# Patient Record
Sex: Male | Born: 1985 | Race: Black or African American | Hispanic: No | Marital: Single | Smoking: Current every day smoker
Health system: Southern US, Community
[De-identification: ages and names within clinical notes are randomized; demographics above are authoritative.]

## PROBLEM LIST (undated history)

## (undated) ENCOUNTER — Emergency Department: Admission: EM | Payer: Self-pay | Source: Home / Self Care

## (undated) DIAGNOSIS — I10 Essential (primary) hypertension: Secondary | ICD-10-CM

## (undated) DIAGNOSIS — J45909 Unspecified asthma, uncomplicated: Secondary | ICD-10-CM

## (undated) HISTORY — PX: ABDOMINAL SURGERY: SHX537

---

## 2006-12-09 ENCOUNTER — Emergency Department: Payer: Self-pay | Admitting: Emergency Medicine

## 2007-02-04 ENCOUNTER — Emergency Department: Payer: Self-pay | Admitting: Emergency Medicine

## 2007-02-04 ENCOUNTER — Other Ambulatory Visit: Payer: Self-pay

## 2019-04-27 ENCOUNTER — Other Ambulatory Visit: Payer: Self-pay | Admitting: Family Medicine

## 2019-04-27 DIAGNOSIS — Z20822 Contact with and (suspected) exposure to covid-19: Secondary | ICD-10-CM

## 2019-05-01 LAB — NOVEL CORONAVIRUS, NAA: SARS-CoV-2, NAA: NOT DETECTED

## 2019-05-09 ENCOUNTER — Telehealth: Payer: Self-pay

## 2019-05-09 NOTE — Telephone Encounter (Signed)
Advised patient his COVID 19 test results are negative. °

## 2019-07-03 ENCOUNTER — Encounter: Payer: Self-pay | Admitting: Emergency Medicine

## 2019-07-03 ENCOUNTER — Other Ambulatory Visit: Payer: Self-pay

## 2019-07-03 ENCOUNTER — Emergency Department
Admission: EM | Admit: 2019-07-03 | Discharge: 2019-07-03 | Disposition: A | Discharge status: Home / Self Care | Payer: HRSA Program | Attending: Emergency Medicine | Admitting: Emergency Medicine

## 2019-07-03 DIAGNOSIS — J029 Acute pharyngitis, unspecified: Secondary | ICD-10-CM | POA: Diagnosis not present

## 2019-07-03 DIAGNOSIS — R05 Cough: Secondary | ICD-10-CM | POA: Insufficient documentation

## 2019-07-03 DIAGNOSIS — Z20828 Contact with and (suspected) exposure to other viral communicable diseases: Secondary | ICD-10-CM | POA: Diagnosis not present

## 2019-07-03 DIAGNOSIS — J45909 Unspecified asthma, uncomplicated: Secondary | ICD-10-CM | POA: Insufficient documentation

## 2019-07-03 DIAGNOSIS — R0981 Nasal congestion: Secondary | ICD-10-CM | POA: Insufficient documentation

## 2019-07-03 DIAGNOSIS — R5381 Other malaise: Secondary | ICD-10-CM | POA: Insufficient documentation

## 2019-07-03 DIAGNOSIS — Z20822 Contact with and (suspected) exposure to covid-19: Secondary | ICD-10-CM

## 2019-07-03 DIAGNOSIS — F17210 Nicotine dependence, cigarettes, uncomplicated: Secondary | ICD-10-CM | POA: Insufficient documentation

## 2019-07-03 HISTORY — DX: Unspecified asthma, uncomplicated: J45.909

## 2019-07-03 MED ORDER — PSEUDOEPH-BROMPHEN-DM 30-2-10 MG/5ML PO SYRP: 10.0000 mL | ORAL_SOLUTION | Freq: Four times a day (QID) | ORAL | 0 refills | Status: DC | PRN

## 2019-07-03 MED ORDER — FLUTICASONE PROPIONATE 50 MCG/ACT NA SUSP: 1.0000 | Freq: Two times a day (BID) | NASAL | 0 refills | Status: AC

## 2019-07-03 NOTE — ED Provider Notes (Signed)
[facility] Emergency Department Provider Note  ____________________________________________  Time seen: Approximately 5:39 PM  I have reviewed the triage vital signs and the nursing notes.   HISTORY  Chief Complaint URI    HPI Harold Jackson is a 33 y.o. male who presents the emergency department for evaluation of nasal congestion, sore throat, cough, malaise.  Patient reports that symptoms began 3 days ago primarily with nasal congestion.  The rest of the symptoms have progressed until patient developed a worsening cough today.  Patient reports that he was sent home from work given his symptoms and concern for COVID-19.  Patient has no known direct COVID-19 contact.  No medications prior to arrival.  Patient denies any headache, neck pain or stiffness, chest pain, shortness of breath, abdominal pain, nausea vomiting, diarrhea or constipation.  No other complaints at this time.         Past Medical History:  Diagnosis Date  . Asthma     There are no active problems to display for this patient.   Past Surgical History:  Procedure Laterality Date  . ABDOMINAL SURGERY      Prior to Admission medications   Medication Sig Start Date End Date Taking? Authorizing Provider  brompheniramine-pseudoephedrine-DM 30-2-10 MG/5ML syrup Take 10 mLs by mouth 4 (four) times daily as needed. 07/03/19   Cuthriell, Charline Bills, PA-C  fluticasone (FLONASE) 50 MCG/ACT nasal spray Place 1 spray into both nostrils 2 (two) times daily. 07/03/19   Cuthriell, Charline Bills, PA-C    Allergies Patient has no known allergies.  No family history on file.  Social History Social History   Tobacco Use  . Smoking status: Current Every Day Smoker    Types: Cigarettes  . Smokeless tobacco: Never Used  Substance Use Topics  . Alcohol use: Yes  . Drug use: Never     Review of Systems  Constitutional: No fever/chills Eyes: No visual changes. No discharge ENT: Positive for nasal  congestion Cardiovascular: no chest pain. Respiratory: Positive cough. No SOB. Gastrointestinal: No abdominal pain.  No nausea, no vomiting.  No diarrhea.  No constipation. Genitourinary: Negative for dysuria. No hematuria Musculoskeletal: Negative for musculoskeletal pain. Skin: Negative for rash, abrasions, lacerations, ecchymosis. Neurological: Negative for headaches, focal weakness or numbness. 10-point ROS otherwise negative.  ____________________________________________   PHYSICAL EXAM:  VITAL SIGNS: ED Triage Vitals  Enc Vitals Group     BP 07/03/19 1718 (!) 155/98     Pulse Rate 07/03/19 1718 71     Resp 07/03/19 1718 16     Temp 07/03/19 1718 98.4 F (36.9 C)     Temp Source 07/03/19 1718 Oral     SpO2 07/03/19 1718 98 %     Weight 07/03/19 1716 245 lb (111.1 kg)     Height 07/03/19 1716 6\' 3"  (1.905 m)     Head Circumference --      Peak Flow --      Pain Score 07/03/19 1716 0     Pain Loc --      Pain Edu? --      Excl. in [place]? --      Constitutional: Alert and oriented. Well appearing and in no acute distress. Eyes: Conjunctivae are normal. PERRL. EOMI. Head: Atraumatic. ENT:      Ears: EACs and TMs unremarkable bilaterally.      Nose: Mild clear congestion/rhinnorhea.      Mouth/Throat: Mucous membranes are moist.  Oropharynx is nonerythematous and nonedematous.  Uvula is midline. Neck:  No stridor.  Neck is supple full range of motion Hematological/Lymphatic/Immunilogical: No cervical lymphadenopathy. Cardiovascular: Normal rate, regular rhythm. Normal S1 and S2.  Good peripheral circulation. Respiratory: Normal respiratory effort without tachypnea or retractions. Lungs CTAB. Good air entry to the bases with no decreased or absent breath sounds. Musculoskeletal: Full range of motion to all extremities. No gross deformities appreciated. Neurologic:  Normal speech and language. No gross focal neurologic deficits are appreciated.  Skin:  Skin is warm, dry and  intact. No rash noted. Psychiatric: Mood and affect are normal. Speech and behavior are normal. Patient exhibits appropriate insight and judgement.   ____________________________________________   LABS (all labs ordered are listed, but only abnormal results are displayed)  Labs Reviewed  SARS CORONAVIRUS 2 (TAT 6-24 HRS)   ____________________________________________  EKG   ____________________________________________  RADIOLOGY   No results found.  ____________________________________________    PROCEDURES  Procedure(s) performed:    Procedures    Medications - No data to display   ____________________________________________   INITIAL IMPRESSION / ASSESSMENT AND PLAN / ED COURSE  Pertinent labs & imaging results that were available during my care of the patient were reviewed by me and considered in my medical decision making (see chart for details).  Review of the  CSRS was performed in accordance of the [facility] prior to dispensing any controlled drugs.           Patient's diagnosis is consistent with suspected given 19 infection.  Patient presented to the emergency department complaining of nasal congestion, malaise, cough.  Patient was sent home from work for COVID-19 test.  Differential includes viral URI, bronchitis, pneumonia, COVID-19.  Patient will be tested for COVID-19 at this time.  Patient be given Flonase for nasal congestion, Bromfed cough syrup for cough.  Patient is to self quarantine until results have returned..  Follow-up primary care as needed. patient is given ED precautions to return to the ED for any worsening or new symptoms.     ____________________________________________  FINAL CLINICAL IMPRESSION(S) / ED DIAGNOSES  Final diagnoses:  Suspected Covid-19 Virus Infection      NEW MEDICATIONS STARTED DURING THIS VISIT:  ED Discharge Orders         Ordered    fluticasone (FLONASE) 50 MCG/ACT nasal spray  2 times daily      07/03/19 1801    brompheniramine-pseudoephedrine-DM 30-2-10 MG/5ML syrup  4 times daily PRN     07/03/19 1801              This chart was dictated using voice recognition software/Dragon. Despite best efforts to proofread, errors can occur which can change the meaning. Any change was purely unintentional.    Racheal PatchesCuthriell, Jonathan D, PA-C 07/03/19 1801    Arnaldo NatalMalinda, Paul F, MD 07/03/19 2255

## 2019-07-03 NOTE — ED Triage Notes (Signed)
C/O sinus congestion and cough.  Initially started Saturday and worsened Sunday.  AAOx3.  Skin warm and dry.  No SOB/ DOE

## 2019-07-04 LAB — SARS CORONAVIRUS 2 (TAT 6-24 HRS): SARS Coronavirus 2: NEGATIVE

## 2019-07-26 ENCOUNTER — Emergency Department
Admission: EM | Admit: 2019-07-26 | Discharge: 2019-07-26 | Disposition: A | Discharge status: Home / Self Care | Payer: Self-pay | Attending: Student in an Organized Health Care Education/Training Program | Admitting: Student in an Organized Health Care Education/Training Program

## 2019-07-26 ENCOUNTER — Encounter: Payer: Self-pay | Admitting: Emergency Medicine

## 2019-07-26 ENCOUNTER — Other Ambulatory Visit: Payer: Self-pay

## 2019-07-26 DIAGNOSIS — J45909 Unspecified asthma, uncomplicated: Secondary | ICD-10-CM | POA: Insufficient documentation

## 2019-07-26 DIAGNOSIS — I10 Essential (primary) hypertension: Secondary | ICD-10-CM | POA: Insufficient documentation

## 2019-07-26 DIAGNOSIS — F17210 Nicotine dependence, cigarettes, uncomplicated: Secondary | ICD-10-CM | POA: Insufficient documentation

## 2019-07-26 LAB — BASIC METABOLIC PANEL
Anion gap: 11 (ref 5–15)
BUN: 8 mg/dL (ref 6–20)
CO2: 26 mmol/L (ref 22–32)
Calcium: 9.6 mg/dL (ref 8.9–10.3)
Chloride: 101 mmol/L (ref 98–111)
Creatinine, Ser: 1.24 mg/dL (ref 0.61–1.24)
GFR calc Af Amer: >60 mL/min (ref >60)
GFR calc non Af Amer: >60 mL/min (ref >60)
Glucose, Bld: 120 mg/dL — ABNORMAL HIGH (ref 70–99)
Potassium: 3.8 mmol/L (ref 3.5–5.1)
Sodium: 138 mmol/L (ref 135–145)

## 2019-07-26 MED ORDER — HYDROCHLOROTHIAZIDE 12.5 MG PO CAPS: 12.5000 mg | ORAL_CAPSULE | Freq: Every day | ORAL | 0 refills | Status: DC

## 2019-07-26 NOTE — ED Provider Notes (Signed)
[facility] Emergency Department Provider Note    First MD Initiated Contact with Patient 07/26/19 Harold Jackson     (approximate)  I have reviewed the triage vital signs and the nursing notes.   HISTORY  Chief Complaint Hypertension    HPI Harold Jackson is a 33 y.o. male no significant past medical history presents to ER for evaluation of elevated blood pressure that was measured plasma center while he was trying to donate today.  Has been taking some anti-inflammatories.  Works on [employer].  Denies any history of heart disease.  No history of hypertension personally but does have significant family history.  His brother who is only 53 years older than him was recently diagnosed with high blood pressure and placed on blood pressure medication has been well managed.  He denies any chest pain headache blurry vision, numbness or tingling.    Past Medical History:  Diagnosis Date  . Asthma    No family history on file. Past Surgical History:  Procedure Laterality Date  . ABDOMINAL SURGERY     There are no active problems to display for this patient.     Prior to Admission medications   Medication Sig Start Date End Date Taking? Authorizing Provider  brompheniramine-pseudoephedrine-DM 30-2-10 MG/5ML syrup Take 10 mLs by mouth 4 (four) times daily as needed. 07/03/19   Cuthriell, Charline Bills, PA-C  fluticasone (FLONASE) 50 MCG/ACT nasal spray Place 1 spray into both nostrils 2 (two) times daily. 07/03/19   Cuthriell, Charline Bills, PA-C  hydrochlorothiazide (MICROZIDE) 12.5 MG capsule Take 1 capsule (12.5 mg total) by mouth daily. 07/26/19 07/25/20  Merlyn Lot, MD    Allergies Patient has no known allergies.    Social History Social History   Tobacco Use  . Smoking status: Current Every Day Smoker    Types: Cigarettes  . Smokeless tobacco: Never Used  Substance Use Topics  . Alcohol use: Yes  . Drug use: Never    Review of Systems Patient  denies headaches, rhinorrhea, blurry vision, numbness, shortness of breath, chest pain, edema, cough, abdominal pain, nausea, vomiting, diarrhea, dysuria, fevers, rashes or hallucinations unless otherwise stated above in HPI. ____________________________________________   PHYSICAL EXAM:  VITAL SIGNS: Vitals:   07/26/19 1830  BP: (!) 182/127  Pulse: 72  Resp: 16  Temp: 97.8 F (36.6 C)  SpO2: 100%    Constitutional: Alert and oriented.  Eyes: Conjunctivae are normal.  Head: Atraumatic. Nose: No congestion/rhinnorhea. Mouth/Throat: Mucous membranes are moist.   Neck: No stridor. Painless ROM.  Cardiovascular: Normal rate, regular rhythm. Grossly normal heart sounds.  Good peripheral circulation. Respiratory: Normal respiratory effort.  No retractions. Lungs CTAB. Gastrointestinal: Soft and nontender. No distention. No abdominal bruits. No CVA tenderness. Genitourinary:  Musculoskeletal: No lower extremity tenderness nor edema.  No joint effusions. Neurologic:  Normal speech and language. No gross focal neurologic deficits are appreciated. No facial droop Skin:  Skin is warm, dry and intact. No rash noted. Psychiatric: Mood and affect are normal. Speech and behavior are normal.  ____________________________________________   LABS (all labs ordered are listed, but only abnormal results are displayed)  Results for orders placed or performed during the hospital encounter of 07/26/19 (from the past 24 hour(s))  Basic metabolic panel     Status: Abnormal   Collection Time: 07/26/19  7:35 PM  Result Value Ref Range   Sodium 138 135 - 145 mmol/L   Potassium 3.8 3.5 - 5.1 mmol/L   Chloride 101 98 -  111 mmol/L   CO2 26 22 - 32 mmol/L   Glucose, Bld 120 (H) 70 - 99 mg/dL   BUN 8 6 - 20 mg/dL   Creatinine, Ser 4.56 0.61 - 1.24 mg/dL   Calcium 9.6 8.9 - 25.6 mg/dL   GFR calc non Af Amer >60 >60 mL/min   GFR calc Af Amer >60 >60 mL/min   Anion gap 11 5 - 15    ____________________________________________  EKG____________________________________________   PROCEDURES  Procedure(s) performed:  Procedures    Critical Care performed: no ____________________________________________   INITIAL IMPRESSION / ASSESSMENT AND PLAN / ED COURSE  Pertinent labs & imaging results that were available during my care of the patient were reviewed by me and considered in my medical decision making (see chart for details).   DDX: htn, htnive urgency, stress, whitecoat htn  Harold Jackson is a 33 y.o. who presents to the ED with concern of elevated blood pressure.  He is completely asymptomatic with reassuring exam.  Denies any chest pain or shortness of breath.  Baseline renal function electrolytes ordered as I do want to start him on some antihypertensive medication given his family history and without any PCP at this time.  Renal function is okay.  Will start him on low-dose HCTZ.  His sister works at a local clinic so hopefully we can get him an outpatient follow-up.  He feels comfortable with this plan.  Denies any other concerns or symptoms.  Have discussed with the patient and available family all diagnostics and treatments performed thus far and all questions were answered to the best of my ability. The patient demonstrates understanding and agreement with plan.      The patient was evaluated in Emergency Department today for the symptoms described in the history of present illness. He/she was evaluated in the context of the global COVID-19 pandemic, which necessitated consideration that the patient might be at risk for infection with the SARS-CoV-2 virus that causes COVID-19. Institutional protocols and algorithms that pertain to the evaluation of patients at risk for COVID-19 are in a state of rapid change based on information released by regulatory bodies including the CDC and federal and state organizations. These policies and algorithms were followed  during the patient's care in the ED.  As part of my medical decision making, I reviewed the following data within the electronic MEDICAL RECORD NUMBER Nursing notes reviewed and incorporated, Labs reviewed, notes from prior ED visits and [place] Controlled Substance Database   ____________________________________________   FINAL CLINICAL IMPRESSION(S) / ED DIAGNOSES  Final diagnoses:  Hypertension, unspecified type      NEW MEDICATIONS STARTED DURING THIS VISIT:  New Prescriptions   HYDROCHLOROTHIAZIDE (MICROZIDE) 12.5 MG CAPSULE    Take 1 capsule (12.5 mg total) by mouth daily.     Note:  This document was prepared using Dragon voice recognition software and may include unintentional dictation errors.    Willy Eddy, MD 07/26/19 [phone]

## 2019-07-26 NOTE — ED Notes (Addendum)
Patient reports familial history of hypertension in first degree family members. Denies previous individual history of high blood pressure. Denies headaches. Denies vision changes. Denies shortness of breath. Endorses intermittent (L) eye vision blurriness secondary to lump that has been present since childhood.

## 2019-07-26 NOTE — ED Triage Notes (Signed)
Patient reports he was supposed to donate plasma and predonation blood pressure was extremely elevated. Patient denies any HA, dizziness or CP. Patient states "I feel fine". Patient denies hx of HTN, however reports significant family history.

## 2019-07-26 NOTE — ED Notes (Signed)
No protocols needed at this time per Dr. Corky Downs

## 2019-08-25 ENCOUNTER — Other Ambulatory Visit: Payer: Self-pay

## 2019-08-25 ENCOUNTER — Emergency Department
Admission: EM | Admit: 2019-08-25 | Discharge: 2019-08-25 | Disposition: A | Discharge status: Home / Self Care | Payer: Self-pay | Attending: Emergency Medicine | Admitting: Emergency Medicine

## 2019-08-25 ENCOUNTER — Encounter: Payer: Self-pay | Admitting: Emergency Medicine

## 2019-08-25 DIAGNOSIS — J45909 Unspecified asthma, uncomplicated: Secondary | ICD-10-CM | POA: Insufficient documentation

## 2019-08-25 DIAGNOSIS — Z20828 Contact with and (suspected) exposure to other viral communicable diseases: Secondary | ICD-10-CM | POA: Insufficient documentation

## 2019-08-25 DIAGNOSIS — F17210 Nicotine dependence, cigarettes, uncomplicated: Secondary | ICD-10-CM | POA: Insufficient documentation

## 2019-08-25 DIAGNOSIS — Z20822 Contact with and (suspected) exposure to covid-19: Secondary | ICD-10-CM

## 2019-08-25 LAB — SARS CORONAVIRUS 2 (TAT 6-24 HRS): SARS Coronavirus 2: NEGATIVE
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
## 2019-09-26 ENCOUNTER — Emergency Department
Admission: EM | Admit: 2019-09-26 | Discharge: 2019-09-26 | Disposition: A | Discharge status: Home / Self Care | Payer: Self-pay | Attending: Emergency Medicine | Admitting: Emergency Medicine

## 2019-09-26 ENCOUNTER — Other Ambulatory Visit: Payer: Self-pay

## 2019-09-26 ENCOUNTER — Encounter: Payer: Self-pay | Admitting: Emergency Medicine

## 2019-09-26 DIAGNOSIS — Z20828 Contact with and (suspected) exposure to other viral communicable diseases: Secondary | ICD-10-CM | POA: Insufficient documentation

## 2019-09-26 DIAGNOSIS — Z79899 Other long term (current) drug therapy: Secondary | ICD-10-CM | POA: Insufficient documentation

## 2019-09-26 DIAGNOSIS — Z20822 Contact with and (suspected) exposure to covid-19: Secondary | ICD-10-CM

## 2019-09-26 DIAGNOSIS — F17210 Nicotine dependence, cigarettes, uncomplicated: Secondary | ICD-10-CM | POA: Insufficient documentation
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
## 2019-09-26 NOTE — ED Provider Notes (Signed)
[facility] Emergency Department Provider Note  ____________________________________________  Time seen: Approximately 2:50 PM  I have reviewed the triage vital signs and the nursing notes.   HISTORY  Chief Complaint Medical exam    HPI Harold Jackson is a 33 y.o. male who presents the emergency department requesting a Covid swab.  Patient reports that his 61 year old daughter is positive for Covid.  His daughter lives in [place] but he visited over the weekend.  Patient did not have any direct contact with his daughter but both his employer as well as his longer needs a negative Covid test as patient is due in court.  Patient has no symptoms at this time and denies any fevers or chills, headache, nasal congestion, sore throat, cough, shortness of breath, abdominal pain, nausea vomiting, diarrhea.  Patient is here for COVID-19 test only.         Past Medical History:  Diagnosis Date  . Asthma     There are no active problems to display for this patient.   Past Surgical History:  Procedure Laterality Date  . ABDOMINAL SURGERY      Prior to Admission medications   Medication Sig Start Date End Date Taking? Authorizing Provider  brompheniramine-pseudoephedrine-DM 30-2-10 MG/5ML syrup Take 10 mLs by mouth 4 (four) times daily as needed. 07/03/19   Wendelin Reader, Delorise Royals, PA-C  fluticasone (FLONASE) 50 MCG/ACT nasal spray Place 1 spray into both nostrils 2 (two) times daily. 07/03/19   Harshal Sirmon, Delorise Royals, PA-C  hydrochlorothiazide (MICROZIDE) 12.5 MG capsule Take 1 capsule (12.5 mg total) by mouth daily. 07/26/19 07/25/20  Willy Eddy, MD    Allergies Patient has no known allergies.  No family history on file.  Social History Social History   Tobacco Use  . Smoking status: Current Every Day Smoker    Types: Cigarettes  . Smokeless tobacco: Never Used  Substance Use Topics  . Alcohol use: Yes  . Drug use: Never     Review of Systems   Constitutional: No fever/chills Eyes: No visual changes. No discharge ENT: No upper respiratory complaints. Cardiovascular: no chest pain. Respiratory: no cough. No SOB. Gastrointestinal: No abdominal pain.  No nausea, no vomiting.  No diarrhea.  No constipation. Musculoskeletal: Negative for musculoskeletal pain. Skin: Negative for rash, abrasions, lacerations, ecchymosis. Neurological: Negative for headaches, focal weakness or numbness. 10-point ROS otherwise negative.  ____________________________________________   PHYSICAL EXAM:  VITAL SIGNS: ED Triage Vitals  Enc Vitals Group     BP 09/26/19 1430 (!) 176/110     Pulse Rate 09/26/19 1430 96     Resp 09/26/19 1430 20     Temp 09/26/19 1430 98.6 F (37 C)     Temp Source 09/26/19 1430 Oral     SpO2 09/26/19 1430 99 %     Weight 09/26/19 1432 245 lb (111.1 kg)     Height 09/26/19 1432 6\' 3"  (1.905 m)     Head Circumference --      Peak Flow --      Pain Score 09/26/19 1432 0     Pain Loc --      Pain Edu? --      Excl. in GC? --      Constitutional: Alert and oriented. Well appearing and in no acute distress. Eyes: Conjunctivae are normal. PERRL. EOMI. Head: Atraumatic. ENT:      Ears:       Nose: No congestion/rhinnorhea.      Mouth/Throat: Mucous membranes are moist.  Neck:  No stridor.    Cardiovascular: Normal rate, regular rhythm. Normal S1 and S2.  Good peripheral circulation. Respiratory: Normal respiratory effort without tachypnea or retractions. Lungs CTAB. Good air entry to the bases with no decreased or absent breath sounds. Musculoskeletal: Full range of motion to all extremities. No gross deformities appreciated. Neurologic:  Normal speech and language. No gross focal neurologic deficits are appreciated.  Skin:  Skin is warm, dry and intact. No rash noted. Psychiatric: Mood and affect are normal. Speech and behavior are normal. Patient exhibits appropriate insight and  judgement.   ____________________________________________   LABS (all labs ordered are listed, but only abnormal results are displayed)  Labs Reviewed  NOVEL CORONAVIRUS, NAA (HOSP ORDER, SEND-OUT TO REF LAB; TAT 18-24 HRS)   ____________________________________________  EKG   ____________________________________________  RADIOLOGY   No results found.  ____________________________________________    PROCEDURES  Procedure(s) performed:    Procedures    Medications - No data to display   ____________________________________________   INITIAL IMPRESSION / ASSESSMENT AND PLAN / ED COURSE  Pertinent labs & imaging results that were available during my care of the patient were reviewed by me and considered in my medical decision making (see chart for details).  Review of the [place] CSRS was performed in accordance of the [place] prior to dispensing any controlled drugs.           Patient's diagnosis is consistent with encounter for COVID-19 test.  Patient is asymptomatic was presenting to the emergency department for testing only.  Patient needs it for return to work clearance as well as a upcoming court date.  Testing is performed today.  Patient knows how to check his results.  Follow-up primary care as needed..  Patient is given ED precautions to return to the ED for any worsening or new symptoms.     ____________________________________________  FINAL CLINICAL IMPRESSION(S) / ED DIAGNOSES  Final diagnoses:  Encounter for laboratory testing for COVID-19 virus      NEW MEDICATIONS STARTED DURING THIS VISIT:  ED Discharge Orders    None          This chart was dictated using voice recognition software/Dragon. Despite best efforts to proofread, errors can occur which can change the meaning. Any change was purely unintentional.    Darletta Moll, PA-C 09/26/19 1520    Earleen Newport, MD 09/26/19 [phone]

## 2019-09-27 LAB — NOVEL CORONAVIRUS, NAA (HOSP ORDER, SEND-OUT TO REF LAB; TAT 18-24 HRS): SARS-CoV-2, NAA: NOT DETECTED

## 2019-10-02 ENCOUNTER — Encounter: Payer: Self-pay | Admitting: Emergency Medicine

## 2019-10-02 ENCOUNTER — Other Ambulatory Visit: Payer: Self-pay

## 2019-10-02 ENCOUNTER — Emergency Department
Admission: EM | Admit: 2019-10-02 | Discharge: 2019-10-02 | Disposition: A | Discharge status: Home / Self Care | Payer: Self-pay | Attending: Emergency Medicine | Admitting: Emergency Medicine

## 2019-10-02 DIAGNOSIS — F17210 Nicotine dependence, cigarettes, uncomplicated: Secondary | ICD-10-CM | POA: Insufficient documentation

## 2019-10-02 DIAGNOSIS — J029 Acute pharyngitis, unspecified: Secondary | ICD-10-CM | POA: Insufficient documentation

## 2019-10-02 DIAGNOSIS — Z20828 Contact with and (suspected) exposure to other viral communicable diseases: Secondary | ICD-10-CM | POA: Insufficient documentation

## 2019-10-02 DIAGNOSIS — J45909 Unspecified asthma, uncomplicated: Secondary | ICD-10-CM | POA: Insufficient documentation

## 2019-10-02 DIAGNOSIS — Z79899 Other long term (current) drug therapy: Secondary | ICD-10-CM | POA: Insufficient documentation

## 2019-10-02 LAB — GROUP A STREP BY PCR: Group A Strep by PCR: NOT DETECTED

## 2019-10-02 LAB — SARS CORONAVIRUS 2 (TAT 6-24 HRS): SARS Coronavirus 2: NEGATIVE

## 2019-10-02 NOTE — ED Notes (Signed)

## 2019-10-02 NOTE — Discharge Instructions (Addendum)
Follow-up with the [facility] if any continued Covid testing needs to be done in the future.  This test is available for free at the health department that has multiple locations including the visitor entrance of the hospital.  Tylenol or ibuprofen as needed for throat pain.  Increase fluids.  You are quarantined until you receive the test results of your Covid test.  Anything that says negative or not detected means that you do not have Covid.

## 2019-10-02 NOTE — ED Provider Notes (Addendum)
[facility] Emergency Department Provider Note   ____________________________________________   First MD Initiated Contact with Patient 10/02/19 1222     (approximate)  I have reviewed the triage vital signs and the nursing notes.   HISTORY  Chief Complaint Sore Throat   HPI Harold Jackson is a 33 y.o. male presents to the ED with complaint of sore throat for the last 3 days.  Patient states he has increased pain with swallowing.  He is unaware of any fever and denies chills.  He states that there has been no change in taste or smell.  He is unaware of any exposure to Covid.  He is states that his work is making him come to have a Covid test done.  He reports that he was in [place] recently due to his daughter being positive for Covid however he was not allowed in the room with her.  In looking through his past records he has told other providers that his daughter is positive for Covid and on his last visit needed a note and a Covid test because he was due in court.  Today patient is requesting another Covid test as he states that his test last time was inconclusive.  Patient rates his pain as 6/10.      Past Medical History:  Diagnosis Date  . Asthma     There are no problems to display for this patient.   Past Surgical History:  Procedure Laterality Date  . ABDOMINAL SURGERY      Prior to Admission medications   Medication Sig Start Date End Date Taking? Authorizing Provider  fluticasone (FLONASE) 50 MCG/ACT nasal spray Place 1 spray into both nostrils 2 (two) times daily. 07/03/19   Cuthriell, Delorise Royals, PA-C  hydrochlorothiazide (MICROZIDE) 12.5 MG capsule Take 1 capsule (12.5 mg total) by mouth daily. 07/26/19 07/25/20  Willy Eddy, MD    Allergies Patient has no known allergies.  No family history on file.  Social History Social History   Tobacco Use  . Smoking status: Current Every Day Smoker    Types: Cigarettes  . Smokeless  tobacco: Never Used  Substance Use Topics  . Alcohol use: Yes  . Drug use: Never    Review of Systems Constitutional: No fever/chills Eyes: No visual changes. ENT: Positive for sore throat. Cardiovascular: Denies chest pain. Respiratory: Denies shortness of breath. Gastrointestinal: No abdominal pain.  No nausea, no vomiting. Musculoskeletal: Negative for muscle skeletal pain. Skin: Negative for rash. Neurological: Negative for headaches, focal weakness or numbness. ____________________________________________   PHYSICAL EXAM:  VITAL SIGNS: ED Triage Vitals [10/02/19 1206]  Enc Vitals Group     BP      Pulse      Resp      Temp      Temp src      SpO2      Weight 245 lb (111.1 kg)     Height 6\' 3"  (1.905 m)     Head Circumference      Peak Flow      Pain Score 6     Pain Loc      Pain Edu?      Excl. in GC?     Constitutional: Alert and oriented. Well appearing and in no acute distress. Eyes: Conjunctivae are normal.  Head: Atraumatic. Nose: No congestion/rhinnorhea. Mouth/Throat: Mucous membranes are moist.  Oropharynx non-erythematous.  No tonsillar exudate and uvula is midline.  Mild posterior drainage noted. Neck: No stridor.  Cardiovascular: Normal rate, regular rhythm. Grossly normal heart sounds.  Good peripheral circulation. Respiratory: Normal respiratory effort.  No retractions. Lungs CTAB. Musculoskeletal: His upper and lower extremities without any difficulty.  Normal gait was noted. Neurologic:  Normal speech and language. No gross focal neurologic deficits are appreciated. No gait instability. Skin:  Skin is warm, dry and intact. No rash noted. Psychiatric: Mood and affect are normal. Speech and behavior are normal.  ____________________________________________   LABS (all labs ordered are listed, but only abnormal results are displayed)  Labs Reviewed  GROUP A STREP BY PCR  SARS CORONAVIRUS 2 (TAT 6-24 HRS)    PROCEDURES  Procedure(s)  performed (including Critical Care):  Procedures   ____________________________________________   INITIAL IMPRESSION / ASSESSMENT AND PLAN / ED COURSE  As part of my medical decision making, I reviewed the following data within the electronic MEDICAL RECORD NUMBER Notes from prior ED visits and [place] Controlled Substance Database  Harold Jackson was evaluated in Emergency Department on 10/02/2019 for the symptoms described in the history of present illness. He was evaluated in the context of the global COVID-19 pandemic, which necessitated consideration that the patient might be at risk for infection with the SARS-CoV-2 virus that causes COVID-19. Institutional protocols and algorithms that pertain to the evaluation of patients at risk for COVID-19 are in a state of rapid change based on information released by regulatory bodies including the CDC and federal and state organizations. These policies and algorithms were followed during the patient's care in the ED.  33 year old male presents to the ED requesting a Covid test.  He states that his last test was inconclusive and that his work is requesting that he be tested before coming back to work.  Patient denies all symptoms of Covid with the exception of sore throat for the last 3 days.  He is unaware of any fever, chills, change in smell or taste.  Strep test was negative and at the time of discharge patient was told he should quarantine until his receive the results of his Covid test.  He was also given a note to take to work.  ____________________________________________   FINAL CLINICAL IMPRESSION(S) / ED DIAGNOSES  Final diagnoses:  Viral pharyngitis     ED Discharge Orders    None       Note:  This document was prepared using Dragon voice recognition software and may include unintentional dictation errors.    Johnn Hai, PA-C 10/02/19 1444    Johnn Hai, PA-C 10/02/19 1444    Nena Polio, MD 10/02/19  [phone]

## 2019-10-02 NOTE — ED Notes (Signed)
See triage note   Presents with sore throat for 3 days   Having some increased pain with swallowing

## 2019-10-02 NOTE — ED Triage Notes (Signed)
Pt reports sore throat for 3 days and unsure of fever.

## 2019-10-03 ENCOUNTER — Telehealth: Payer: Self-pay

## 2019-10-03 NOTE — Telephone Encounter (Signed)
Need result faxed to [employer] attn: Lovena Le, at [phone].

## 2019-10-03 NOTE — Telephone Encounter (Signed)
Per pt. Request, faxed recent COVID test result to [employer]; attnLovena Le, @ [phone].

## 2020-04-16 ENCOUNTER — Telehealth: Payer: Self-pay | Admitting: General Practice

## 2020-04-16 NOTE — Telephone Encounter (Signed)
Individual has been contacted 3+ times regarding ED referral and has been given information regarding how to become a pt. No further attempts to contact individual will be made. 

## 2020-05-06 ENCOUNTER — Other Ambulatory Visit: Payer: Self-pay

## 2020-05-06 ENCOUNTER — Emergency Department
Admission: EM | Admit: 2020-05-06 | Discharge: 2020-05-06 | Disposition: A | Discharge status: Home / Self Care | Payer: Self-pay | Attending: Emergency Medicine | Admitting: Emergency Medicine

## 2020-05-06 DIAGNOSIS — F17210 Nicotine dependence, cigarettes, uncomplicated: Secondary | ICD-10-CM | POA: Insufficient documentation

## 2020-05-06 DIAGNOSIS — Z79899 Other long term (current) drug therapy: Secondary | ICD-10-CM | POA: Insufficient documentation

## 2020-05-06 DIAGNOSIS — J45909 Unspecified asthma, uncomplicated: Secondary | ICD-10-CM | POA: Insufficient documentation

## 2020-05-06 DIAGNOSIS — Z7952 Long term (current) use of systemic steroids: Secondary | ICD-10-CM | POA: Insufficient documentation

## 2020-05-06 DIAGNOSIS — I10 Essential (primary) hypertension: Secondary | ICD-10-CM | POA: Insufficient documentation

## 2020-05-06 LAB — BASIC METABOLIC PANEL
Anion gap: 10 (ref 5–15)
BUN: 15 mg/dL (ref 6–20)
CO2: 26 mmol/L (ref 22–32)
Calcium: 9.1 mg/dL (ref 8.9–10.3)
Chloride: 105 mmol/L (ref 98–111)
Creatinine, Ser: 1.33 mg/dL — ABNORMAL HIGH (ref 0.61–1.24)
GFR calc Af Amer: >60 mL/min (ref >60)
GFR calc non Af Amer: >60 mL/min (ref >60)
Glucose, Bld: 99 mg/dL (ref 70–99)
Potassium: 4 mmol/L (ref 3.5–5.1)
Sodium: 141 mmol/L (ref 135–145)

## 2020-05-06 LAB — CBC
HCT: 47.2 % (ref 39.0–52.0)
Hemoglobin: 16.3 g/dL (ref 13.0–17.0)
MCH: 31 pg (ref 26.0–34.0)
MCHC: 34.5 g/dL (ref 30.0–36.0)
MCV: 89.7 fL (ref 80.0–100.0)
Platelets: 185 10*3/uL (ref 150–400)
RBC: 5.26 MIL/uL (ref 4.22–5.81)
RDW: 13.1 % (ref 11.5–15.5)
WBC: 4.1 10*3/uL (ref 4.0–10.5)
nRBC: 0 % (ref 0.0–0.2)

## 2020-05-06 MED ORDER — HYDROCHLOROTHIAZIDE 25 MG PO TABS: 25.0000 mg | ORAL_TABLET | Freq: Every day | ORAL | 2 refills | Status: DC

## 2020-05-06 MED ORDER — SODIUM CHLORIDE 0.9% FLUSH: 3.0000 mL | Freq: Once | INTRAVENOUS | Status: DC

## 2020-05-06 NOTE — ED Triage Notes (Signed)
Pt comes via POV from work with c/o hypertension and dizziness. Pt states he was at work and started to feel dizzy. Pt states headache.  Pt states some tingling over body.

## 2020-05-06 NOTE — ED Provider Notes (Signed)
[facility] Emergency Department Provider Note  Time seen: 1:49 PM  I have reviewed the triage vital signs and the nursing notes.   HISTORY  Chief Complaint Hypertension   HPI Harold Jackson is a 34 y.o. male with a past medical history of asthma presents to the emergency department for high blood pressure.  According to the patient he was at work today when he began feeling somewhat dizzy which he describes as a tingling over his body.  Patient states he has had this issue before when his blood pressure goes up.  Patient states he was started on hydrochlorothiazide about a year ago but only took it for a week or 2 then stopped.  Is not taking any other hypertensive medications.  Patient denies any chest pain or shortness of breath, no abdominal pain vomiting or diarrhea.  Largely negative review of systems otherwise.  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   Past Medical History:  Diagnosis Date  . Asthma     There are no problems to display for this patient.   Past Surgical History:  Procedure Laterality Date  . ABDOMINAL SURGERY      Prior to Admission medications   Medication Sig Start Date End Date Taking? Authorizing Provider  fluticasone (FLONASE) 50 MCG/ACT nasal spray Place 1 spray into both nostrils 2 (two) times daily. 07/03/19   Cuthriell, Delorise Royals, PA-C  hydrochlorothiazide (MICROZIDE) 12.5 MG capsule Take 1 capsule (12.5 mg total) by mouth daily. 07/26/19 07/25/20  Willy Eddy, MD    No Known Allergies  No family history on file.  Social History Social History   Tobacco Use  . Smoking status: Current Every Day Smoker    Types: Cigarettes  . Smokeless tobacco: Never Used  Substance Use Topics  . Alcohol use: Yes    Comment: occ  . Drug use: Never    Review of Systems Constitutional: Negative for fever. Cardiovascular: Negative for chest pain. Respiratory: Negative for shortness of  breath. Gastrointestinal: Negative for abdominal pain, vomiting and diarrhea. Genitourinary: Negative for urinary compaints Musculoskeletal: Negative for musculoskeletal complaints Skin: Negative for skin complaints  Neurological: Negative for headache All other ROS negative  ____________________________________________   PHYSICAL EXAM:  VITAL SIGNS: ED Triage Vitals [05/06/20 1147]  Enc Vitals Group     BP (!) 157/104     Pulse Rate 92     Resp 18     Temp 98.4 F (36.9 C)     Temp src      SpO2 98 %     Weight 240 lb (108.9 kg)     Height 6\' 3"  (1.905 m)     Head Circumference      Peak Flow      Pain Score 0     Pain Loc      Pain Edu?      Excl. in GC?    Constitutional: Alert and oriented. Well appearing and in no distress. Eyes: Normal exam ENT      Head: Normocephalic and atraumatic.      Mouth/Throat: Mucous membranes are moist. Cardiovascular: Normal rate, regular rhythm.  Respiratory: Normal respiratory effort without tachypnea nor retractions. Breath sounds are clear Gastrointestinal: Soft and nontender. No distention Musculoskeletal: Nontender with normal range of motion in all extremities. No lower extremity tenderness or edema. Neurologic:  Normal speech and language. No gross focal neurologic deficits Skin:  Skin is warm, dry and intact.  Psychiatric: Mood and affect are normal.   ____________________________________________    EKG  EKG viewed and interpreted by myself shows a normal sinus rhythm at 77 bpm with a narrow QRS, normal axis, normal intervals, no concerning ST changes.  ____________________________________________    INITIAL IMPRESSION / ASSESSMENT AND PLAN / ED COURSE  Pertinent labs & imaging results that were available during my care of the patient were reviewed by me and considered in my medical decision making (see chart for details).   Patient presents emergency department for dizziness feels like his blood pressure is  elevated.  Patient's blood pressure in the emergency department is 157/104.  Patient is not taking any medications currently.  The remainder the patient's lab work is reassuring, EKG is reassuring, physical exam is reassuring.  Patient has a largely negative review of systems.  Patient is feeling much better currently.  We will start the patient on hydrochlorothiazide I discussed PCP follow-up.  Patient agreeable to plan of care.  Discussed return precautions.  Harold Jackson was evaluated in Emergency Department on 05/06/2020 for the symptoms described in the history of present illness. He was evaluated in the context of the global COVID-19 pandemic, which necessitated consideration that the patient might be at risk for infection with the SARS-CoV-2 virus that causes COVID-19. Institutional protocols and algorithms that pertain to the evaluation of patients at risk for COVID-19 are in a state of rapid change based on information released by regulatory bodies including the CDC and federal and state organizations. These policies and algorithms were followed during the patient's care in the ED.  ____________________________________________   FINAL CLINICAL IMPRESSION(S) / ED DIAGNOSES  Hypertension   Minna Antis, MD 05/06/20 1351

## 2021-06-25 ENCOUNTER — Emergency Department
Admission: EM | Admit: 2021-06-25 | Discharge: 2021-06-25 | Disposition: A | Discharge status: Home / Self Care | Payer: Self-pay | Attending: Emergency Medicine | Admitting: Emergency Medicine

## 2021-06-25 ENCOUNTER — Encounter: Payer: Self-pay | Admitting: Emergency Medicine

## 2021-06-25 ENCOUNTER — Other Ambulatory Visit: Payer: Self-pay

## 2021-06-25 DIAGNOSIS — Z7951 Long term (current) use of inhaled steroids: Secondary | ICD-10-CM | POA: Insufficient documentation

## 2021-06-25 DIAGNOSIS — F17210 Nicotine dependence, cigarettes, uncomplicated: Secondary | ICD-10-CM | POA: Insufficient documentation

## 2021-06-25 DIAGNOSIS — H01004 Unspecified blepharitis left upper eyelid: Secondary | ICD-10-CM | POA: Insufficient documentation

## 2021-06-25 DIAGNOSIS — J45909 Unspecified asthma, uncomplicated: Secondary | ICD-10-CM | POA: Insufficient documentation

## 2021-06-25 MED ORDER — ERYTHROMYCIN 5 MG/GM OP OINT: 1.0000 "application " | TOPICAL_OINTMENT | Freq: Two times a day (BID) | OPHTHALMIC | 0 refills | Status: AC

## 2021-06-25 NOTE — ED Triage Notes (Signed)
C/O left eye swelling and pain x 2 days.

## 2021-06-25 NOTE — ED Notes (Signed)
Visual acuity preformed. Findings are as followed: R eye 20/20, L eye 20/40, and bilateral 20/20.

## 2021-06-25 NOTE — ED Notes (Addendum)
Verbalized understanding discharge instructions, prescription, and follow-up. In no acute distress.   Pt attempted to sign.  Pad does not work.

## 2021-06-25 NOTE — ED Notes (Signed)
ED Provider at bedside. 

## 2021-06-25 NOTE — ED Provider Notes (Signed)
[facility] Emergency Department Provider Note  ____________________________________________  Time seen: Approximately 9:16 AM  I have reviewed the triage vital signs and the nursing notes.   HISTORY  Chief Complaint Eye Problem    HPI Harold Jackson is a 35 y.o. male with no significant past medical history who comes ED complaining of left upper eyelid swelling and discomfort for the past several days.  No trauma.  No eye pain.  Normal vision.  No headache.  No fever.  Denies thick eye discharge, no pain with eye movements.  Symptoms have been constant, no aggravating or alleviating factors.    Past Medical History:  Diagnosis Date   Asthma      There are no problems to display for this patient.    Past Surgical History:  Procedure Laterality Date   ABDOMINAL SURGERY       Prior to Admission medications   Medication Sig Start Date End Date Taking? Authorizing Provider  erythromycin ophthalmic ointment Place 1 application into the left eye 2 (two) times daily for 7 days. 06/25/21 07/02/21 Yes Sharman Cheek, MD  fluticasone [facility]) 50 MCG/ACT nasal spray Place 1 spray into both nostrils 2 (two) times daily. 07/03/19   Cuthriell, Delorise Royals, PA-C  hydrochlorothiazide (HYDRODIURIL) 25 MG tablet Take 1 tablet (25 mg total) by mouth daily. 05/06/20   Minna Antis, MD     Allergies Patient has no known allergies.   No family history on file.  Social History Social History   Tobacco Use   Smoking status: Every Day    Types: Cigarettes   Smokeless tobacco: Never  Substance Use Topics   Alcohol use: Yes    Comment: occ   Drug use: Never    Review of Systems  Constitutional:   No fever or chills.  ENT:   No sore throat. No rhinorrhea. Cardiovascular:   No chest pain or syncope. Respiratory:   No dyspnea or cough. Gastrointestinal:   Negative for abdominal pain, vomiting and diarrhea.  Musculoskeletal:   Negative for focal pain  or swelling All other systems reviewed and are negative except as documented above in ROS and HPI.  ____________________________________________   PHYSICAL EXAM:  VITAL SIGNS: ED Triage Vitals  Enc Vitals Group     BP 06/25/21 0848 (!) 156/115     Pulse Rate 06/25/21 0848 (!) 108     Resp 06/25/21 0848 20     Temp 06/25/21 0848 98.3 F (36.8 C)     Temp Source 06/25/21 0848 Oral     SpO2 06/25/21 0848 96 %     Weight 06/25/21 0844 240 lb 3.4 oz (109 kg)     Height 06/25/21 0844 6\' 3"  (1.905 m)     Head Circumference --      Peak Flow --      Pain Score 06/25/21 0844 8     Pain Loc --      Pain Edu? --      Excl. in GC? --     Vital signs reviewed, nursing assessments reviewed.   Constitutional:   Alert and oriented. Non-toxic appearance. Eyes:   Bulbar conjunctive a normal.  PERRLA.  Lower eyelids normal.  Left upper eyelid shows diffuse mild swelling and tenderness.  Not warm to the touch, no fluctuance, no drainage.  There is some purplish discoloration diffusely, associated with the  lid margin.  EOMI and painless. ENT      Head:   Normocephalic and atraumatic.  Mouth/Throat:   MMM      Neck:   No meningismus. Full ROM. Hematological/Lymphatic/Immunilogical:   No cervical lymphadenopathy. Cardiovascular:   RRR.  Cap refill less than 2 seconds. Respiratory:   Normal respiratory effort without tachypnea/retractions. Musculoskeletal:   Normal range of motion in all extremities.   Neurologic:   Normal speech and language.  Motor grossly intact. No acute focal neurologic deficits are appreciated.   ____________________________________________    LABS (pertinent positives/negatives) (all labs ordered are listed, but only abnormal results are displayed) Labs Reviewed - No data to display ____________________________________________   EKG  ____________________________________________    RADIOLOGY  No results  found.  ____________________________________________   PROCEDURES Procedures  ____________________________________________  CLINICAL IMPRESSION / ASSESSMENT AND PLAN / ED COURSE  Pertinent labs & imaging results that were available during my care of the patient were reviewed by me and considered in my medical decision making (see chart for details).  Harold Jackson was evaluated in Emergency Department on 06/25/2021 for the symptoms described in the history of present illness. He was evaluated in the context of the global COVID-19 pandemic, which necessitated consideration that the patient might be at risk for infection with the SARS-CoV-2 virus that causes COVID-19. Institutional protocols and algorithms that pertain to the evaluation of patients at risk for COVID-19 are in a state of rapid change based on information released by regulatory bodies including the CDC and federal and state organizations. These policies and algorithms were followed during the patient's care in the ED.   Patient presents with left upper eyelid blepharitis.  No eye involvement, no orbital cellulitis or glaucoma or uveitis.  Will treat with topical erythromycin, follow-up with PCP.  Advised patient to follow-up with PCP in 1 week for blood pressure recheck as well.      ____________________________________________   FINAL CLINICAL IMPRESSION(S) / ED DIAGNOSES    Final diagnoses:  Blepharitis of left upper eyelid, unspecified type     ED Discharge Orders          Ordered    erythromycin ophthalmic ointment  2 times daily        06/25/21 0915            Portions of this note were generated with dragon dictation software. Dictation errors may occur despite best attempts at proofreading.   Sharman Cheek, MD 06/25/21 [phone]

## 2021-11-21 ENCOUNTER — Encounter: Payer: Self-pay | Admitting: Emergency Medicine

## 2021-11-21 ENCOUNTER — Emergency Department
Admission: EM | Admit: 2021-11-21 | Discharge: 2021-11-21 | Disposition: A | Discharge status: Home / Self Care | Payer: Self-pay | Attending: Emergency Medicine | Admitting: Emergency Medicine

## 2021-11-21 ENCOUNTER — Other Ambulatory Visit: Payer: Self-pay

## 2021-11-21 DIAGNOSIS — M65272 Calcific tendinitis, left ankle and foot: Secondary | ICD-10-CM | POA: Insufficient documentation

## 2021-11-21 DIAGNOSIS — M7752 Other enthesopathy of left foot: Secondary | ICD-10-CM

## 2021-11-21 MED ORDER — MELOXICAM 15 MG PO TABS: 15.0000 mg | ORAL_TABLET | Freq: Every day | ORAL | 0 refills | Status: AC

## 2021-11-21 MED ORDER — MELOXICAM 7.5 MG PO TABS
15.0000 mg | ORAL_TABLET | Freq: Once | ORAL | Status: AC
  Administered 2021-11-21: 15 mg via ORAL
  Filled 2021-11-21: qty 2

## 2021-11-21 NOTE — ED Triage Notes (Signed)
Pt reports pain to his left ankle for 2 weeks. Pt reports has to wear steel toe shoes for work and thinks that is what is causing the pain. Pt does not wish to file WC at this time

## 2021-11-21 NOTE — ED Provider Notes (Signed)
[facility] Provider Note  Patient Contact: 5:25 PM (approximate)   History   Ankle Pain   HPI  Harold Jackson is a 36 y.o. male who presents the emergency department complaining of ankle pain.  This has been intermittent for the past 2 weeks.  No known injury.  Patient states that he was diagnosed with Achilles tendinitis but not given any medications for same.  Has had some worsening pain after standing for prolonged periods of time.  He also has to wear steel toed boots and feels that the extra weight is increasing his symptoms.  Patient states that he was limping and his boss noticed and stated that he cannot come back to work until he had a note clearing him.  Patient with no other complaint at this time.     Physical Exam   Triage Vital Signs: ED Triage Vitals  Enc Vitals Group     BP 11/21/21 1623 (!) 171/120     Pulse Rate 11/21/21 1623 88     Resp 11/21/21 1623 18     Temp 11/21/21 1623 97.9 F (36.6 C)     Temp Source 11/21/21 1623 Oral     SpO2 11/21/21 1623 97 %     Weight 11/21/21 1616 245 lb (111.1 kg)     Height 11/21/21 1616 6\' 3"  (1.905 m)     Head Circumference --      Peak Flow --      Pain Score 11/21/21 1616 5     Pain Loc --      Pain Edu? --      Excl. in GC? --     Most recent vital signs: Vitals:   11/21/21 1623 11/21/21 1635  BP: (!) 171/120 (!) 161/115  Pulse: 88   Resp: 18   Temp: 97.9 F (36.6 C)   SpO2: 97%      General: Alert and in no acute distress.  Cardiovascular:  Good peripheral perfusion Respiratory: Normal respiratory effort without tachypnea or retractions. Lungs CTAB.  Musculoskeletal: Full range of motion to all extremities.  Visualization of the left ankle reveals no edema.  No ecchymosis.  Good range of motion.  Is tender over the Achilles tendon distribution with no deficit.  Dorsalis pedis pulses sensation intact. Neurologic:  No gross focal neurologic deficits are appreciated.  Skin:   No  rash noted Other:   ED Results / Procedures / Treatments   Labs (all labs ordered are listed, but only abnormal results are displayed) Labs Reviewed - No data to display   EKG     RADIOLOGY   No results found.  PROCEDURES:  Critical Care performed: No  Procedures   MEDICATIONS ORDERED IN ED: Medications  meloxicam (MOBIC) tablet 15 mg (has no administration in time range)     IMPRESSION / MDM / ASSESSMENT AND PLAN / ED COURSE  I reviewed the triage vital signs and the nursing notes.                              Differential diagnosis includes, but is not limited to, tendinitis, COVID tendon rupture, fracture, sprain   Patient's diagnosis is consistent with COVID tendinitis.  Patient presented to the emergency department with pain to the left ankle.  This is been worsening over the past 2 weeks.  It is intermittent in nature.  Patient has been diagnosed with Achilles tendinitis but not placed on any  medications.  Symptoms and physical exam are consistent with tendinitis and he will be placed on anti-inflammatory.  Follow-up primary care or orthopedics if needed.  Return precautions discussed with the patient.  No indication for imaging or labs at this time.. Patient will be discharged home with prescriptions for meloxicam.  Patient is given ED precautions to return to the ED for any worsening or new symptoms.       FINAL CLINICAL IMPRESSION(S) / ED DIAGNOSES   Final diagnoses:  Tendinitis of left ankle     Rx / DC Orders   ED Discharge Orders          Ordered    meloxicam (MOBIC) 15 MG tablet  Daily        11/21/21 1727             Note:  This document was prepared using Dragon voice recognition software and may include unintentional dictation errors.   Lanette Hampshire 11/21/21 1730    Sharman Cheek, MD 11/21/21 [phone]

## 2022-02-24 ENCOUNTER — Emergency Department
Admission: EM | Admit: 2022-02-24 | Discharge: 2022-02-24 | Disposition: A | Discharge status: Home / Self Care | Payer: Self-pay | Attending: Student in an Organized Health Care Education/Training Program | Admitting: Student in an Organized Health Care Education/Training Program

## 2022-02-24 ENCOUNTER — Other Ambulatory Visit: Payer: Self-pay

## 2022-02-24 ENCOUNTER — Encounter: Payer: Self-pay | Admitting: Emergency Medicine

## 2022-02-24 ENCOUNTER — Emergency Department: Payer: Self-pay

## 2022-02-24 DIAGNOSIS — I10 Essential (primary) hypertension: Secondary | ICD-10-CM | POA: Insufficient documentation

## 2022-02-24 DIAGNOSIS — R778 Other specified abnormalities of plasma proteins: Secondary | ICD-10-CM | POA: Insufficient documentation

## 2022-02-24 DIAGNOSIS — R0789 Other chest pain: Secondary | ICD-10-CM

## 2022-02-24 DIAGNOSIS — I160 Hypertensive urgency: Secondary | ICD-10-CM | POA: Insufficient documentation

## 2022-02-24 DIAGNOSIS — Z79899 Other long term (current) drug therapy: Secondary | ICD-10-CM | POA: Insufficient documentation

## 2022-02-24 LAB — BASIC METABOLIC PANEL
Anion gap: 7 (ref 5–15)
BUN: 28 mg/dL — ABNORMAL HIGH (ref 6–20)
CO2: 27 mmol/L (ref 22–32)
Calcium: 9.9 mg/dL (ref 8.9–10.3)
Chloride: 107 mmol/L (ref 98–111)
Creatinine, Ser: 1.39 mg/dL — ABNORMAL HIGH (ref 0.61–1.24)
GFR, Estimated: >60 mL/min (ref >60)
Glucose, Bld: 100 mg/dL — ABNORMAL HIGH (ref 70–99)
Potassium: 3.7 mmol/L (ref 3.5–5.1)
Sodium: 141 mmol/L (ref 135–145)

## 2022-02-24 LAB — CBC
HCT: 46.4 % (ref 39.0–52.0)
Hemoglobin: 15.7 g/dL (ref 13.0–17.0)
MCH: 29.8 pg (ref 26.0–34.0)
MCHC: 33.8 g/dL (ref 30.0–36.0)
MCV: 88 fL (ref 80.0–100.0)
Platelets: 214 10*3/uL (ref 150–400)
RBC: 5.27 MIL/uL (ref 4.22–5.81)
RDW: 12.2 % (ref 11.5–15.5)
WBC: 5.8 10*3/uL (ref 4.0–10.5)
nRBC: 0 % (ref 0.0–0.2)

## 2022-02-24 LAB — TROPONIN I (HIGH SENSITIVITY)
Troponin I (High Sensitivity): 27 ng/L — ABNORMAL HIGH (ref <18)
Troponin I (High Sensitivity): 25 ng/L — ABNORMAL HIGH (ref <18)

## 2022-02-24 MED ORDER — HYDROCHLOROTHIAZIDE 12.5 MG PO TABS
12.5000 mg | ORAL_TABLET | Freq: Every day | ORAL | Status: DC
  Administered 2022-02-24: 12.5 mg via ORAL
  Filled 2022-02-24: qty 1

## 2022-02-24 MED ORDER — AMLODIPINE BESYLATE 5 MG PO TABS
5.0000 mg | ORAL_TABLET | Freq: Every day | ORAL | 11 refills | Status: DC
  Filled 2022-02-24 – 2022-07-08 (×2): qty 30, 30d supply, fill #0
  Filled 2022-08-02: qty 30, 30d supply, fill #1

## 2022-02-24 MED ORDER — AMLODIPINE BESYLATE 5 MG PO TABS
5.0000 mg | ORAL_TABLET | Freq: Once | ORAL | Status: AC
  Administered 2022-02-24: 5 mg via ORAL
  Filled 2022-02-24: qty 1

## 2022-02-24 NOTE — TOC Initial Note (Signed)
Transition of Care (TOC) - Initial/Assessment Note  ? ? ?Patient Details  ?Name: Harold Jackson ?MRN: [id number] ?Date of Birth: 1986-07-07 ? ?Transition of Care (TOC) CM/SW Contact:    ?Allayne Butcher, RN ?Phone Number: ?02/24/2022, 2:32 PM ? ?Clinical Narrative:                 ?Patient seen for hypertension in the emergency department.  Patient does not currently have insurance, he does work for [employer] but he works for a [employer] so no benefits at this time.  His girlfriend provides transportation for him he is not currently driving.  He needs a PCP and medication assistance.  Gave patient information on all the [facility] community clinics, informed him that [facility] in [place] would be able to see him the soonest and then he could try to get into one of the closer ones after getting established.  RNCM also provided patient with information and application for [facility].  He would like to use [facility] for prescriptions.  He is aware that they close at 1630 and only open M-F.   ?He lives off of [address] so he can actually walk home from the hospital.   ? ?Expected Discharge Plan: Home/Self Care ?  ? ? ?Patient Goals and CMS Choice ?Patient states their goals for this hospitalization and ongoing recovery are:: needs PCP ?  ?  ? ?Expected Discharge Plan and Services ?Expected Discharge Plan: Home/Self Care ?  ?Discharge Planning Services: CM Consult, Medication Assistance, Indigent Health Clinic ?  ?Living arrangements for the past 2 months: Apartment ?                ?DME Arranged: N/A ?DME Agency: NA ?  ?  ?  ?HH Arranged: NA ?HH Agency: NA ?  ?  ?  ? ?Prior Living Arrangements/Services ?Living arrangements for the past 2 months: Apartment ?Lives with:: Self ?Patient language and need for interpreter reviewed:: Yes ?       ?Need for Family Participation in Patient Care: Yes (Comment) ?Care giver support system in place?: Yes (comment) (Girlfriend) ?  ?Criminal Activity/Legal  Involvement Pertinent to Current Situation/Hospitalization: No - Comment as needed ? ?Activities of Daily Living ?  ?  ? ?Permission Sought/Granted ?  ?  ?   ? Permission granted to share info w AGENCY: [facility] ?   ?   ? ?Emotional Assessment ?Appearance:: Appears stated age ?Attitude/Demeanor/Rapport: Engaged ?Affect (typically observed): Accepting ?Orientation: : Oriented to Self, Oriented to  Time, Oriented to Place, Oriented to Situation ?Alcohol / Substance Use: Not Applicable ?Psych Involvement: No (comment) ? ?Admission diagnosis:  elevated bp,  223/110,  R shoulder pain ?There are no problems to display for this patient. ? ?PCP:  Pcp, No ?Pharmacy:   ?[facility] ?[address] ?[place] [address] ?Phone: [phone] Fax: [phone] ? ?[facility] [address] [place] - [address] ?[address] ?[place] [address] ?Phone: [phone] Fax: [phone] ? ? ? ? ?Social Determinants of Health (SDOH) Interventions ?  ? ?Readmission Risk Interventions ?   ? View : No data to display.  ?  ?  ?  ? ? ? ?

## 2022-02-24 NOTE — ED Triage Notes (Signed)
Pt presents via pov with hypertension and right shoulder pain. Pt reports bps have been in 200s systolic at home. Pt reports does not take any bp med- has been trying to get into pcp but theyre booked out for months. Pt bp in triage 205/134 ?

## 2022-02-24 NOTE — ED Provider Triage Note (Signed)
?  Emergency Medicine Provider Triage Evaluation Note ? ?Harold Jackson , a 35 y.o.male,  was evaluated in triage.  Pt complains of right-sided chest pain, hypertension.  Orts elevated blood pressure at home for the past week, reportedly in the 200s systolic.  He has been additionally experiencing right-sided shoulder pain as well. ? ? ?Review of Systems  ?Positive: Shoulder pain. ?Negative: Denies fever, chest pain, vomiting ? ?Physical Exam  ? ?Vitals:  ? 02/24/22 1217  ?BP: (!) 205/134  ?Pulse: 79  ?Resp: 17  ?Temp: 98.4 ?F (36.9 ?C)  ?SpO2: 100%  ? ?Gen:   Awake, no distress   ?Resp:  Normal effort  ?MSK:   Moves extremities without difficulty  ?Other:   ? ?Medical Decision Making  ?Given the patient's initial medical screening exam, the following diagnostic evaluation has been ordered. The patient will be placed in the appropriate treatment space, once one is available, to complete the evaluation and treatment. I have discussed the plan of care with the patient and I have advised the patient that an ED physician or mid-level practitioner will reevaluate their condition after the test results have been received, as the results may give them additional insight into the type of treatment they may need.  ? ? ?Diagnostics: Labs, CXR, EKG ? ?Treatments: none immediately ?  ?Varney Daily, PA ?02/24/22 1222 ? ?

## 2022-02-24 NOTE — ED Provider Notes (Signed)
? ?[facility] ?Provider Note ? ? ? Event Date/Time  ? First MD Initiated Contact with Patient 02/24/22 1258   ?  (approximate) ? ? ?History  ? ?Hypertension ? ? ?HPI ? ?Harold Jackson is a 36 y.o. male   with a longstanding history of hypertension not currently on antihypertensive medications because of lack of insurance presents to the ER for several months of right-sided chest pressure.  Patient states he does feel stressed.  Denies any diaphoresis no nausea or vomiting no numbness or tingling no headaches.  No lower extremity swelling.  Does have family history of high blood pressure. ? ?  ? ? ?Physical Exam  ? ?Triage Vital Signs: ?ED Triage Vitals  ?Enc Vitals Group  ?   BP 02/24/22 1217 (!) 205/134  ?   Pulse Rate 02/24/22 1217 79  ?   Resp 02/24/22 1217 17  ?   Temp 02/24/22 1217 98.4 ?F (36.9 ?C)  ?   Temp Source 02/24/22 1217 Oral  ?   SpO2 02/24/22 1217 100 %  ?   Weight --   ?   Height 02/24/22 1218 6\' 3"  (1.905 m)  ?   Head Circumference --   ?   Peak Flow --   ?   Pain Score 02/24/22 1218 6  ?   Pain Loc --   ?   Pain Edu? --   ?   Excl. in GC? --   ? ? ?Most recent vital signs: ?Vitals:  ? 02/24/22 1304 02/24/22 1404  ?BP: (!) 187/124 (!) 176/118  ?Pulse:  80  ?Resp:  16  ?Temp:    ?SpO2:  100%  ? ? ? ?Constitutional: Alert  ?Eyes: Conjunctivae are normal.  ?Head: Atraumatic. ?Nose: No congestion/rhinnorhea. ?Mouth/Throat: Mucous membranes are moist.   ?Neck: Painless ROM.  ?Cardiovascular:   Good peripheral circulation. ?Respiratory: Normal respiratory effort.  No retractions.  ?Gastrointestinal: Soft and nontender.  ?Musculoskeletal:  no deformity ?Neurologic:  MAE spontaneously. No gross focal neurologic deficits are appreciated.  ?Skin:  Skin is warm, dry and intact. No rash noted. ?Psychiatric: Mood and affect are normal. Speech and behavior are normal. ? ? ? ?ED Results / Procedures / Treatments  ? ?Labs ?(all labs ordered are listed, but only abnormal results are  displayed) ?Labs Reviewed  ?BASIC METABOLIC PANEL - Abnormal; Notable for the following components:  ?    Result Value  ? Glucose, Bld 100 (*)   ? BUN 28 (*)   ? Creatinine, Ser 1.39 (*)   ? All other components within normal limits  ?TROPONIN I (HIGH SENSITIVITY) - Abnormal; Notable for the following components:  ? Troponin I (High Sensitivity) 27 (*)   ? All other components within normal limits  ?TROPONIN I (HIGH SENSITIVITY) - Abnormal; Notable for the following components:  ? Troponin I (High Sensitivity) 25 (*)   ? All other components within normal limits  ?CBC  ? ? ? ?EKG ? ?ED ECG REPORT ?I, 04/26/22, the attending physician, personally viewed and interpreted this ECG. ? ? Date: 02/24/2022 ? EKG Time: 12:21 ? Rate: 80 ? Rhythm: sinus ? Axis: normal ? Intervals: normal ? ST&T Change: inferolateral twi, no stemi, no depression, borderline lvh ? ? ? ?RADIOLOGY ?Please see ED Course for my review and interpretation. ? ?I personally reviewed all radiographic images ordered to evaluate for the above acute complaints and reviewed radiology reports and findings.  These findings were personally discussed with the patient.  Please  see medical record for radiology report. ? ? ? ?PROCEDURES: ? ?Critical Care performed:  ? ?Procedures ? ? ?MEDICATIONS ORDERED IN ED: ?Medications  ?hydrochlorothiazide (HYDRODIURIL) tablet 12.5 mg (12.5 mg Oral Given 02/24/22 1420)  ?amLODipine (NORVASC) tablet 5 mg (5 mg Oral Given 02/24/22 1304)  ? ? ? ?IMPRESSION / MDM / ASSESSMENT AND PLAN / ED COURSE  ?I reviewed the triage vital signs and the nursing notes. ?             ?               ? ?Differential diagnosis includes, but is not limited to, hypertensive urgency, ACS, electrolyte abnormality, pneumonia, pneumothorax, CHF, dissection ? ?Patient presented to the ER for evaluation of symptoms as described above.  He is well-appearing but significantly hypertensive.  States symptoms have been going on for months to years.  Does  not have any acute chest pain or discomfort no numbness or tingling.  No signs or symptoms of CVA or TIA.  His EKG with some nonspecific T wave abnormalities.  Initial troponin mildly elevated will repeat.  I suspect the patient's having elevated troponins in the setting of hypertension for many months.  Discussed option need for possible hospitalization.  Patient prefer not to be hospitalized at this time.  Serial enzymes are negative seem less consistent with ACS.  Mild renal insufficiency as well.  Clinically he is very well-appearing.  Patient feels comfortable with close outpatient follow-up which I think is reasonable given the duration of his symptoms.  He agrees to return if he starts having any worsening discomfort or change in symptoms.  Consult with social work to make sure the patient has access to her antihypertensive medications which has been sent to the pharmacy. ? ? ?Clinical Course as of 02/24/22 1510  ?Tue Feb 24, 2022  ?1258 X-ray on my review and interpretation does not show any evidence of infiltrate or edema. [PR]  ?  ?Clinical Course User Index ?[PR] Willy Eddy, MD  ? ? ? ?FINAL CLINICAL IMPRESSION(S) / ED DIAGNOSES  ? ?Final diagnoses:  ?Hypertensive urgency  ? ? ? ?Rx / DC Orders  ? ?ED Discharge Orders   ? ?      Ordered  ?  amLODipine (NORVASC) 5 MG tablet  Daily       ? 02/24/22 1500  ?  Ambulatory referral to Cardiology       ?Comments: If you have not heard from the Cardiology office within the next 72 hours please call [phone].  ? 02/24/22 1503  ? ?  ?  ? ?  ? ? ? ?Note:  This document was prepared using Dragon voice recognition software and may include unintentional dictation errors. ? ?  ?Willy Eddy, MD ?02/24/22 1510 ? ?

## 2022-03-25 ENCOUNTER — Ambulatory Visit: Payer: Self-pay | Admitting: Cardiovascular Disease

## 2022-03-29 DIAGNOSIS — I160 Hypertensive urgency: Secondary | ICD-10-CM | POA: Insufficient documentation

## 2022-03-29 NOTE — Progress Notes (Deleted)
No show

## 2022-03-30 ENCOUNTER — Ambulatory Visit: Payer: Self-pay | Admitting: Cardiovascular Disease

## 2022-03-30 ENCOUNTER — Encounter: Payer: Self-pay | Admitting: Cardiovascular Disease

## 2022-03-30 DIAGNOSIS — I160 Hypertensive urgency: Secondary | ICD-10-CM

## 2022-07-07 ENCOUNTER — Encounter: Payer: Self-pay | Admitting: Emergency Medicine

## 2022-07-07 ENCOUNTER — Emergency Department
Admission: EM | Admit: 2022-07-07 | Discharge: 2022-07-07 | Discharge status: Against Medical Advice | Payer: Self-pay | Attending: Physician Assistant | Admitting: Physician Assistant

## 2022-07-07 ENCOUNTER — Emergency Department: Payer: Self-pay

## 2022-07-07 DIAGNOSIS — R059 Cough, unspecified: Secondary | ICD-10-CM | POA: Insufficient documentation

## 2022-07-07 DIAGNOSIS — R0981 Nasal congestion: Secondary | ICD-10-CM | POA: Insufficient documentation

## 2022-07-07 DIAGNOSIS — Z5321 Procedure and treatment not carried out due to patient leaving prior to being seen by health care provider: Secondary | ICD-10-CM | POA: Insufficient documentation

## 2022-07-07 NOTE — ED Triage Notes (Signed)
Pt c/o nasal congestions and cough x2 weeks. Pt to ED due to continued cough that worsens at night. Pt denies fever and SOB. OTC Mucinex taken without relief.

## 2022-07-08 ENCOUNTER — Other Ambulatory Visit: Payer: Self-pay

## 2022-07-08 ENCOUNTER — Emergency Department
Admission: EM | Admit: 2022-07-08 | Discharge: 2022-07-08 | Disposition: A | Discharge status: Home / Self Care | Payer: Self-pay | Attending: Emergency Medicine | Admitting: Emergency Medicine

## 2022-07-08 DIAGNOSIS — I10 Essential (primary) hypertension: Secondary | ICD-10-CM | POA: Insufficient documentation

## 2022-07-08 DIAGNOSIS — J45909 Unspecified asthma, uncomplicated: Secondary | ICD-10-CM | POA: Insufficient documentation

## 2022-07-08 DIAGNOSIS — Z20822 Contact with and (suspected) exposure to covid-19: Secondary | ICD-10-CM | POA: Insufficient documentation

## 2022-07-08 DIAGNOSIS — J069 Acute upper respiratory infection, unspecified: Secondary | ICD-10-CM | POA: Insufficient documentation

## 2022-07-08 LAB — RESP PANEL BY RT-PCR (FLU A&B, COVID) ARPGX2
Influenza A by PCR: NEGATIVE
Influenza B by PCR: NEGATIVE
SARS Coronavirus 2 by RT PCR: NEGATIVE

## 2022-07-08 MED ORDER — AZITHROMYCIN 250 MG PO TABS
ORAL_TABLET | ORAL | 0 refills | Status: DC
  Filled 2022-07-08: qty 6, 5d supply, fill #0

## 2022-07-08 NOTE — ED Notes (Signed)
Pt A&OX4 ambulatory at d/c with independent steady gait. Pt verbalized understanding of d/c instructions, prescription and follow up care. 

## 2022-07-08 NOTE — ED Provider Notes (Signed)
[facility] Emergency Department Provider Note     Event Date/Time   First MD Initiated Contact with Patient 07/08/22 1410     (approximate)   History   Cough   HPI  Harold Jackson is a 36 y.o. male with a history of asthma and poorly controlled high blood pressure, presents to the ED for evaluation of ongoing cough and congestion.  Patient reports 2 weeks of symptoms.  He presented to the ED initially on yesterday, but eloped prior to evaluation.     Physical Exam   Triage Vital Signs: ED Triage Vitals  Enc Vitals Group     BP 07/08/22 1344 (!) 190/102     Pulse Rate 07/08/22 1344 78     Resp 07/08/22 1344 12     Temp 07/08/22 1344 98.4 F (36.9 C)     Temp src --      SpO2 07/08/22 1344 98 %     Weight --      Height 07/08/22 1335 6\' 2"  (1.88 m)     Head Circumference --      Peak Flow --      Pain Score 07/08/22 1335 0     Pain Loc --      Pain Edu? --      Excl. in [place]? --     Most recent vital signs: Vitals:   07/08/22 1344 07/08/22 1531  BP: (!) 190/102 (!) 182/124  Pulse: 78 71  Resp: 12 16  Temp: 98.4 F (36.9 C)   SpO2: 98% 97%    General Awake, no distress.  HEENT NCAT. PERRL. EOMI. No rhinorrhea. Mucous membranes are moist.  CV:  Good peripheral perfusion.  RESP:  Normal effort.  CTA ABD:  No distention.    ED Results / Procedures / Treatments   Labs (all labs ordered are listed, but only abnormal results are displayed) Labs Reviewed  RESP PANEL BY RT-PCR (FLU A&B, COVID) [id number]    EKG   RADIOLOGY  I personally viewed and evaluated these images as part of my medical decision making, as well as reviewing the written report by the radiologist.  ED Provider Interpretation: no acute findings  DG Chest 2 View  Result Date: 07/07/2022 CLINICAL DATA:  Cough for 2 weeks EXAM: CHEST - 2 VIEW COMPARISON:  02/24/2022 FINDINGS: Cardiac shadow is within normal limits. Lungs are well aerated bilaterally. Minimal  scarring is noted in the left mid lung. No infiltrate is seen. No bony abnormality is noted. IMPRESSION: No active cardiopulmonary disease. Electronically Signed   By: Inez Catalina M.D.   On: 07/07/2022 20:31     PROCEDURES:  Critical Care performed: No  Procedures   MEDICATIONS ORDERED IN ED: Medications - No data to display   IMPRESSION / MDM / [place] / ED COURSE  I reviewed the triage vital signs and the nursing notes.                              Differential diagnosis includes, but is not limited to, URI, COVID, flu, CAP, bronchitis  Patient's presentation is most consistent with acute complicated illness / injury requiring diagnostic workup.  Patient to the ED for evaluation of 2 weeks of intermittent cough and congestion.  Evaluated with complaints in ED, found have reassuring work-up overall.  No radiologic evidence of any acute infectious process, based on my review of images and radiology report.  Patient's viral panel screen is also negative at this time.  Patient be treated for bronchitis with azithromycin.  As a courtesy, patient previous prescriptions for amlodipine were verified at the medication management program pharmacy, and a refill was ordered on his behalf.  Patient notes he was unaware that during his last visit in May, he was provided with a years worth of refills.  Patient's diagnosis is consistent with viral URI with cough and uncontrolled hypertension.  Patient is to follow up with local community clinic for ongoing primary care as needed or otherwise directed. Patient is given ED precautions to return to the ED for any worsening or new symptoms.     FINAL CLINICAL IMPRESSION(S) / ED DIAGNOSES   Final diagnoses:  Viral URI with cough  Essential hypertension     Rx / DC Orders   ED Discharge Orders          Ordered    azithromycin (ZITHROMAX Z-PAK) 250 MG tablet        07/08/22 1512             Note:  This document was prepared  using Dragon voice recognition software and may include unintentional dictation errors.    Lissa Hoard, PA-C 07/08/22 1859    Shaune Pollack, MD 07/12/22 [phone]

## 2022-07-08 NOTE — Discharge Instructions (Addendum)
Take the meds as directed. Follow-up with a local community clinic as discussed.

## 2022-07-08 NOTE — ED Triage Notes (Signed)
Pt via POV from home. Pt was seen last night for cough and cong for 2 weeks. States that he was seen last night but LWBS. Pt is A&OX4 and NAD

## 2022-07-09 ENCOUNTER — Telehealth: Payer: Self-pay

## 2022-07-09 NOTE — Telephone Encounter (Signed)
Called pt from ED referrel. Pt did not pick up, left message giving information, can call back if interested.

## 2022-08-03 ENCOUNTER — Other Ambulatory Visit: Payer: Self-pay

## 2022-08-18 ENCOUNTER — Other Ambulatory Visit: Payer: Self-pay

## 2023-05-07 ENCOUNTER — Emergency Department
Admission: EM | Admit: 2023-05-07 | Discharge: 2023-05-08 | Disposition: A | Discharge status: Home / Self Care | Payer: No Typology Code available for payment source | Attending: Emergency Medicine | Admitting: Emergency Medicine

## 2023-05-07 ENCOUNTER — Emergency Department: Payer: No Typology Code available for payment source

## 2023-05-07 DIAGNOSIS — H538 Other visual disturbances: Secondary | ICD-10-CM | POA: Diagnosis not present

## 2023-05-07 DIAGNOSIS — F10220 Alcohol dependence with intoxication, uncomplicated: Secondary | ICD-10-CM | POA: Insufficient documentation

## 2023-05-07 DIAGNOSIS — Y908 Blood alcohol level of 240 mg/100 ml or more: Secondary | ICD-10-CM | POA: Insufficient documentation

## 2023-05-07 DIAGNOSIS — F10920 Alcohol use, unspecified with intoxication, uncomplicated: Secondary | ICD-10-CM

## 2023-05-07 DIAGNOSIS — R55 Syncope and collapse: Secondary | ICD-10-CM | POA: Insufficient documentation

## 2023-05-07 DIAGNOSIS — I10 Essential (primary) hypertension: Secondary | ICD-10-CM | POA: Insufficient documentation

## 2023-05-07 LAB — COMPREHENSIVE METABOLIC PANEL
ALT: 20 U/L (ref 0–44)
AST: 24 U/L (ref 15–41)
Albumin: 4.1 g/dL (ref 3.5–5.0)
Alkaline Phosphatase: 49 U/L (ref 38–126)
Anion gap: 11 (ref 5–15)
BUN: 15 mg/dL (ref 6–20)
CO2: 21 mmol/L — ABNORMAL LOW (ref 22–32)
Calcium: 8.4 mg/dL — ABNORMAL LOW (ref 8.9–10.3)
Chloride: 102 mmol/L (ref 98–111)
Creatinine, Ser: 1.15 mg/dL (ref 0.61–1.24)
GFR, Estimated: >60 mL/min (ref >60)
Glucose, Bld: 98 mg/dL (ref 70–99)
Potassium: 3.4 mmol/L — ABNORMAL LOW (ref 3.5–5.1)
Sodium: 134 mmol/L — ABNORMAL LOW (ref 135–145)
Total Bilirubin: 0.5 mg/dL (ref 0.3–1.2)
Total Protein: 7.3 g/dL (ref 6.5–8.1)

## 2023-05-07 LAB — CBC WITH DIFFERENTIAL/PLATELET
Abs Immature Granulocytes: 0.02 10*3/uL (ref 0.00–0.07)
Basophils Absolute: 0.1 10*3/uL (ref 0.0–0.1)
Basophils Relative: 1 %
Eosinophils Absolute: 0.2 10*3/uL (ref 0.0–0.5)
Eosinophils Relative: 2 %
HCT: 46.3 % (ref 39.0–52.0)
Hemoglobin: 16.2 g/dL (ref 13.0–17.0)
Immature Granulocytes: 0 %
Lymphocytes Relative: 26 %
Lymphs Abs: 1.7 10*3/uL (ref 0.7–4.0)
MCH: 30.8 pg (ref 26.0–34.0)
MCHC: 35 g/dL (ref 30.0–36.0)
MCV: 88 fL (ref 80.0–100.0)
Monocytes Absolute: 0.6 10*3/uL (ref 0.1–1.0)
Monocytes Relative: 9 %
Neutro Abs: 4 10*3/uL (ref 1.7–7.7)
Neutrophils Relative %: 62 %
Platelets: 211 10*3/uL (ref 150–400)
RBC: 5.26 MIL/uL (ref 4.22–5.81)
RDW: 12.1 % (ref 11.5–15.5)
WBC: 6.5 10*3/uL (ref 4.0–10.5)
nRBC: 0 % (ref 0.0–0.2)

## 2023-05-07 LAB — URINE DRUG SCREEN, QUALITATIVE (ARMC ONLY)
Amphetamines, Ur Screen: NOT DETECTED
Barbiturates, Ur Screen: NOT DETECTED
Benzodiazepine, Ur Scrn: NOT DETECTED
Cannabinoid 50 Ng, Ur ~~LOC~~: NOT DETECTED
Cocaine Metabolite,Ur ~~LOC~~: NOT DETECTED
MDMA (Ecstasy)Ur Screen: NOT DETECTED
Methadone Scn, Ur: NOT DETECTED
Opiate, Ur Screen: NOT DETECTED
Phencyclidine (PCP) Ur S: NOT DETECTED
Tricyclic, Ur Screen: NOT DETECTED

## 2023-05-07 LAB — TROPONIN I (HIGH SENSITIVITY): Troponin I (High Sensitivity): 16 ng/L (ref <18)

## 2023-05-07 LAB — ETHANOL: Alcohol, Ethyl (B): 320 mg/dL (ref <10)

## 2023-05-07 MED ORDER — HYDROCHLOROTHIAZIDE 25 MG PO TABS
25.0000 mg | ORAL_TABLET | Freq: Once | ORAL | Status: AC
  Administered 2023-05-08: 25 mg via ORAL
  Filled 2023-05-07: qty 1

## 2023-05-07 MED ORDER — HYDROCHLOROTHIAZIDE 25 MG PO TABS
25.0000 mg | ORAL_TABLET | Freq: Every day | ORAL | 2 refills | Status: DC
Start: 2023-05-07 — End: 2024-09-12
  Filled 2023-05-07: qty 30, 30d supply, fill #0
  Filled 2023-06-08: qty 30, 30d supply, fill #1
  Filled 2023-06-29: qty 30, 30d supply, fill #2

## 2023-05-07 MED ORDER — TETRACAINE HCL 0.5 % OP SOLN
2.0000 [drp] | Freq: Once | OPHTHALMIC | Status: AC
  Administered 2023-05-07: 2 [drp] via OPHTHALMIC

## 2023-05-07 MED ORDER — LACTATED RINGERS IV BOLUS
1000.0000 mL | Freq: Once | INTRAVENOUS | Status: AC
  Administered 2023-05-07: 1000 mL via INTRAVENOUS

## 2023-05-07 MED ORDER — TETRACAINE HCL 0.5 % OP SOLN
OPHTHALMIC | Status: AC
  Filled 2023-05-07: qty 4

## 2023-05-07 MED ORDER — FLUORESCEIN SODIUM 1 MG OP STRP
1.0000 | ORAL_STRIP | Freq: Once | OPHTHALMIC | Status: AC
  Administered 2023-05-07: 1 via OPHTHALMIC

## 2023-05-07 MED ORDER — FLUORESCEIN SODIUM 1 MG OP STRP
ORAL_STRIP | OPHTHALMIC | Status: AC
  Filled 2023-05-07: qty 2

## 2023-05-07 NOTE — ED Notes (Signed)
VO from MD for no second trop

## 2023-05-07 NOTE — ED Triage Notes (Signed)
Pt presents to the ED via [facility] from home. Per EMS upon arrival he was responsive only to pain with snoring respirations. EMT-P stated that it was almost like a postictal state. Pt denies any hx of seizures. Pt had been outside for 6-8 hours drinking in the heat. Pt states he was grilling. Pt appears to be intoxicated. Pt states that he has not been able to see out of his right eye for three days, but was able to read the fall risk sign from across the room. Pt states that he doesn't remember anything that happened.   1L LR given by EMS

## 2023-05-07 NOTE — ED Provider Notes (Signed)
[facility] Provider Note    Event Date/Time   First MD Initiated Contact with Patient 05/07/23 2043     (approximate)   History   Chief Complaint Alcohol Intoxication   HPI  Harold Jackson is a 37 y.o. male with past medical history of asthma who presents to the ED for alcohol intoxication.  Per EMS, patient was sitting at a cookout with family and had multiple alcoholic beverages, when family noticed that he was swaying from side-to-side.  He then seemed to pass out and began to fall out of his chair, however brother was able to catch him before he hit the ground.  On arrival to the ED, patient initially states that he has been feeling well, does not remember what happened and is not sure why he is here.  He denies any chest pain or shortness of breath, but does state that his vision has been blurry in his right eye for the past 3 days.  He describes it as a fogginess that is similar to "like if you breathe on your glasses."  He denies any associated pain or drainage from his eye.  He admits to consuming multiple shots of tequila, denies drug use.     Physical Exam   Triage Vital Signs: ED Triage Vitals  Encounter Vitals Group     BP 05/07/23 2049 (!) 168/113     Systolic BP Percentile --      Diastolic BP Percentile --      Pulse Rate 05/07/23 2049 95     Resp 05/07/23 2049 (!) 24     Temp 05/07/23 2049 98.7 F (37.1 C)     Temp Source 05/07/23 2049 Oral     SpO2 05/07/23 2049 100 %     Weight --      Height 05/07/23 2046 6\' 2"  (1.88 m)     Head Circumference --      Peak Flow --      Pain Score 05/07/23 2043 0     Pain Loc --      Pain Education --      Exclude from Growth Chart --     Most recent vital signs: Vitals:   05/07/23 2200 05/07/23 2230  BP: (!) 167/117 (!) 168/115  Pulse: 83 87  Resp: (!) 26 14  Temp:    SpO2: 97% 97%    Constitutional: Alert and oriented.  Intoxicated appearing. Eyes: Conjunctivae are normal.  Pupils  equal, round, and reactive to light bilaterally.  Extraocular movements intact. Head: Atraumatic. Nose: No congestion/rhinnorhea. Mouth/Throat: Mucous membranes are moist.  Neck: No midline cervical spine tenderness to palpation. Cardiovascular: Normal rate, regular rhythm. Grossly normal heart sounds.  2+ radial pulses bilaterally. Respiratory: Normal respiratory effort.  No retractions. Lungs CTAB. Gastrointestinal: Soft and nontender. No distention. Musculoskeletal: No lower extremity tenderness nor edema.  Neurologic:  Normal speech and language. No gross focal neurologic deficits are appreciated.    ED Results / Procedures / Treatments   Labs (all labs ordered are listed, but only abnormal results are displayed) Labs Reviewed  COMPREHENSIVE METABOLIC PANEL - Abnormal; Notable for the following components:      Result Value   Sodium 134 (*)    Potassium 3.4 (*)    CO2 21 (*)    Calcium 8.4 (*)    All other components within normal limits  ETHANOL - Abnormal; Notable for the following components:   Alcohol, Ethyl (B) 320 (*)    All  other components within normal limits  CBC WITH DIFFERENTIAL/PLATELET  URINE DRUG SCREEN, QUALITATIVE ([facility] ONLY)  TROPONIN I (HIGH SENSITIVITY)     EKG  ED ECG REPORT I, Chesley Noon, the attending physician, personally viewed and interpreted this ECG.   Date: 05/07/2023  EKG Time: 20:45  Rate: 86  Rhythm: normal sinus rhythm  Axis: Normal  Intervals:none  ST&T Change: LVH, similar to previous  RADIOLOGY CT head reviewed and interpreted by me with no hemorrhage or midline shift.  PROCEDURES:  Critical Care performed: No  Procedures   MEDICATIONS ORDERED IN ED: Medications  hydrochlorothiazide (HYDRODIURIL) tablet 25 mg (has no administration in time range)  lactated ringers bolus 1,000 mL (0 mLs Intravenous Stopped 05/07/23 2220)  tetracaine (PONTOCAINE) 0.5 % ophthalmic solution 2 drop (2 drops Both Eyes Given 05/07/23  2220)  fluorescein ophthalmic strip 1 strip (1 strip Right Eye Given 05/07/23 2220)     IMPRESSION / MDM / ASSESSMENT AND PLAN / ED COURSE  I reviewed the triage vital signs and the nursing notes.                              37 y.o. male with past medical history of asthma presents to the ED following syncopal episode after consuming alcohol with family this afternoon, now reports that he has had blurry vision in his right eye for the past 3 days.  Patient's presentation is most consistent with acute presentation with potential threat to life or bodily function.  Differential diagnosis includes, but is not limited to, arrhythmia, seizure, alcohol intoxication, anemia, electrolyte abnormality, AKI, stroke.  Patient nontoxic-appearing and in no acute distress, vital signs remarkable for hypertension but otherwise reassuring.  He reports blurry vision in his right eye but otherwise has a nonfocal neurologic exam.  We will check CT head and cervical spine given questionable trauma.  EKG shows LVH similar to previous, no ischemic changes noted.  Labs are pending at this time.  CT head and cervical spine are negative for acute process.  Labs show elevated ethanol level but are otherwise reassuring with no significant anemia, leukocytosis, tract abnormality, or AKI.  Troponin within normal limits and I doubt cardiac etiology for his syncopal episode.  Patient more awake and alert on assessment, continues to report 3 days of blurry vision in the right side of his right eye, no symptoms affecting the left eye.  Given symptoms are limited to his right eye, I doubt stroke or other intracranial process.  Visual acuity noted to be 20/30 in right eye and 20/40 on the left.  Case discussed with Dr. Rolley Sims of ophthalmology, who will have patient follow-up at the ophthalmology clinic in the morning.  Patient's blood pressure remains elevated here in the ED, he states he has not taken his blood pressure  medication in multiple months.  We will give initial dose of blood pressure medication and provide prescription, he was also given referral to follow-up with PCP regarding his blood pressure.  Patient counseled to return to the ED for new or worsening symptoms, patient agrees with plan.      FINAL CLINICAL IMPRESSION(S) / ED DIAGNOSES   Final diagnoses:  Syncope, unspecified syncope type  Alcoholic intoxication without complication (HCC)  Uncontrolled hypertension  Blurry vision, right eye     Rx / DC Orders   ED Discharge Orders          Ordered    hydrochlorothiazide (HYDRODIURIL)  25 MG tablet  Daily        05/07/23 2349             Note:  This document was prepared using Dragon voice recognition software and may include unintentional dictation errors.   Chesley Noon, MD 05/08/23 Marlyne Beards

## 2023-05-10 ENCOUNTER — Other Ambulatory Visit: Payer: Self-pay

## 2023-06-08 ENCOUNTER — Other Ambulatory Visit: Payer: Self-pay

## 2023-06-14 ENCOUNTER — Encounter: Payer: Self-pay | Admitting: Emergency Medicine

## 2023-06-14 ENCOUNTER — Emergency Department
Admission: EM | Admit: 2023-06-14 | Discharge: 2023-06-14 | Disposition: A | Discharge status: Home / Self Care | Payer: Medicaid Other | Attending: Emergency Medicine | Admitting: Emergency Medicine

## 2023-06-14 ENCOUNTER — Other Ambulatory Visit: Payer: Self-pay

## 2023-06-14 DIAGNOSIS — Z760 Encounter for issue of repeat prescription: Secondary | ICD-10-CM | POA: Diagnosis not present

## 2023-06-14 DIAGNOSIS — I10 Essential (primary) hypertension: Secondary | ICD-10-CM | POA: Diagnosis not present

## 2023-06-14 DIAGNOSIS — Z91199 Patient's noncompliance with other medical treatment and regimen due to unspecified reason: Secondary | ICD-10-CM | POA: Diagnosis not present

## 2023-06-14 DIAGNOSIS — H538 Other visual disturbances: Secondary | ICD-10-CM | POA: Insufficient documentation

## 2023-06-14 HISTORY — DX: Essential (primary) hypertension: I10

## 2023-06-14 MED ORDER — FLUORESCEIN SODIUM 1 MG OP STRP
1.0000 | ORAL_STRIP | Freq: Once | OPHTHALMIC | Status: AC
  Administered 2023-06-14: 1 via OPHTHALMIC
  Filled 2023-06-14: qty 1

## 2023-06-14 MED ORDER — EYE WASH OP SOLN
1.0000 [drp] | OPHTHALMIC | Status: DC | PRN
  Administered 2023-06-14: 1 [drp] via OPHTHALMIC
  Filled 2023-06-14: qty 118

## 2023-06-14 MED ORDER — TETRACAINE HCL 0.5 % OP SOLN
1.0000 [drp] | Freq: Once | OPHTHALMIC | Status: AC
Start: 2023-06-14 — End: 2023-06-14
  Administered 2023-06-14: 1 [drp] via OPHTHALMIC
  Filled 2023-06-14: qty 4

## 2023-06-14 MED ORDER — HYDROCHLOROTHIAZIDE 25 MG PO TABS
25.0000 mg | ORAL_TABLET | Freq: Once | ORAL | Status: AC
  Administered 2023-06-14: 25 mg via ORAL
  Filled 2023-06-14: qty 1

## 2023-06-14 MED ORDER — AMLODIPINE BESYLATE 5 MG PO TABS
5.0000 mg | ORAL_TABLET | Freq: Once | ORAL | Status: AC
  Administered 2023-06-14: 5 mg via ORAL
  Filled 2023-06-14: qty 1

## 2023-06-14 NOTE — ED Provider Notes (Signed)
[facility] Provider Note    Event Date/Time   First MD Initiated Contact with Patient 06/14/23 0845     (approximate)   History   Eye Problem   HPI  Harold Jackson is a 37 y.o. male   presents to the ED with complaint of blurred vision in his right eye.  He was seen in the emergency department on 05/07/2023 for the same complaint and was told to make an appointment with ophthalmology.  Patient states that the doctor called him on Saturday morning and ask how his vision was and he reported that it was better.  He did not make a follow-up appointment.  Patient states he was driving to work this morning and noticed that his vision was blurry again but reports that it never has completely gone away.  No associated pain, recent injury or drainage.  Patient denies any headache, weakness, difficulty with balance or walking.  Patient has a history of hypertension and has not taken blood pressure medication in the last 2 days.  Patient was seen in the emergency department on 05/07/2023 at which time a CT head and cervical spine along with lab work was obtained.  CMP unremarkable, ethanol level was 320, CT head and cervical spine were negative.      Physical Exam   Triage Vital Signs: ED Triage Vitals  Encounter Vitals Group     BP 06/14/23 0822 (!) 202/133     Systolic BP Percentile --      Diastolic BP Percentile --      Pulse Rate 06/14/23 0820 70     Resp 06/14/23 0820 18     Temp 06/14/23 0818 98.4 F (36.9 C)     Temp src --      SpO2 06/14/23 0820 99 %     Weight 06/14/23 0819 245 lb (111.1 kg)     Height 06/14/23 0819 6\' 3"  (1.905 m)     Head Circumference --      Peak Flow --      Pain Score 06/14/23 0819 0     Pain Loc --      Pain Education --      Exclude from Growth Chart --     Most recent vital signs: Vitals:   06/14/23 0822 06/14/23 1033  BP: (!) 202/133 (!) 185/135  Pulse:  70  Resp:  16  Temp:    SpO2:  99%     General: Awake, no  distress.  Talkative, answers questions appropriately. CV:  Good peripheral perfusion.  Heart regular rate and rhythm. Resp:  Normal effort.  Clear bilaterally. Abd:  No distention.  Other:  PERRLA, EOMI's, conjunctive are clear bilaterally.  Tetracaine applied bilaterally and fluorescein stain was negative for uptake.  No foreign body noted.  Visual acuity was 20/50 each eye.   ED Results / Procedures / Treatments   Labs (all labs ordered are listed, but only abnormal results are displayed) Labs Reviewed - No data to display    PROCEDURES:  Critical Care performed:   Procedures   MEDICATIONS ORDERED IN ED: Medications  eye wash ((SODIUM/POTASSIUM/SOD CHLORIDE)) ophthalmic solution 1 drop (1 drop Right Eye Given 06/14/23 1023)  tetracaine (PONTOCAINE) 0.5 % ophthalmic solution 1 drop (1 drop Right Eye Given 06/14/23 1023)  fluorescein ophthalmic strip 1 strip (1 strip Left Eye Given 06/14/23 1023)  amLODipine (NORVASC) tablet 5 mg (5 mg Oral Given 06/14/23 0927)  hydrochlorothiazide (HYDRODIURIL) tablet 25 mg (25 mg Oral  Given 06/14/23 0927)     IMPRESSION / MDM / ASSESSMENT AND PLAN / ED COURSE  I reviewed the triage vital signs and the nursing notes.   Differential diagnosis includes, but is not limited to, blurred vision right eye, hypertension uncontrolled, medically noncompliant.  37 year old male presents to the ED with complaint of right visual blurriness for which he was seen in the ED on 06/07/2023.  He states that he has continued to have some blurred vision since that time.  Patient was referred to [facility] but has not followed up as of yet.  Exam today is benign and visual acuity is noted.  We discussed his uncontrolled blood pressure at great length.  Patient was given his medication while in the emergency department.  He was given information about long-term effects of uncontrolled hypertension and the damage that it can do to his body.  He agrees to obtain  his medication and take it daily.  He is also to again follow-up with [facility] and information for Dr. Brooke Dare was given to him.  He also requested a note to take to work excusing him for today.      Patient's presentation is most consistent with acute illness / injury with system symptoms.  FINAL CLINICAL IMPRESSION(S) / ED DIAGNOSES   Final diagnoses:  Blurred vision, right eye  Hypertension, unspecified type  Uncontrolled hypertension  Medically noncompliant     Rx / DC Orders   ED Discharge Orders     None        Note:  This document was prepared using Dragon voice recognition software and may include unintentional dictation errors.   Tommi Rumps, PA-C 06/14/23 1325    Trinna Post, MD 06/14/23 [phone]

## 2023-06-14 NOTE — ED Notes (Signed)
See triage notes. Patient stated he has been dealing with vision issues in his right eye for around a month. Patient stated it is beginning in his left eye now. Stated he was referred to an optometrist and hasn't gone.

## 2023-06-14 NOTE — ED Triage Notes (Signed)
Patient to ED via [facility] for eye problem. Pt states his right eye has bene blurry for the past month and is getting worse. Seen 1 month ago for same and did not follow up with eye doctor. Also, has hypertension- ran out of BP meds on Saturday and just hasn't picked up refill at pharmacy yet.

## 2023-06-14 NOTE — Discharge Instructions (Addendum)
Call make an appointment with [facility] for evaluation of your continued blurry vision.  Also get your prescription for your blood pressure as it was elevated in the emergency department and will continue to be 7 to you begin taking your blood pressure medication.  Read the information about hypertension.  A list of clinics is on your discharge papers including [facility] and clinics around the area that are taking new patients.  It is extremely important that you call to make an appointment so that your blood pressure can stay controlled.  It may be necessary for your blood pressure medication to be changed if it is not bringing it down to an acceptable level.   Please go to the following website to schedule new (and existing) patient appointments:   [email or web address]   The following is a list of primary care offices in the area who are accepting new patients at this time.  Please reach out to one of them directly and let them know you would like to schedule an appointment to follow up on an Emergency Department visit, and/or to establish a new primary care provider (PCP).  There are likely other primary care clinics in the are who are accepting new patients, but this is an excellent place to start:  [facility] Lead physician: Dr Shirlee Latch [address] [place], [place] [address] [phone]  [facility] Lead Physician: Dr Alba Cory [address], [place], [place] [address] [phone]  [facility]  Lead Physician: Dr Olevia Perches [address] [place], [place] [address] [phone]  [facility] Lead Physician: Dr Sofie Hartigan [address] [place], [place] [address] [phone]  [facility] Lead Physician: Dr Bari Edward [address] [place], [place], [place] [address] [phone]

## 2023-06-14 NOTE — ED Notes (Signed)
Visual accuity  L = 20/50  R = 20/50

## 2023-06-15 ENCOUNTER — Other Ambulatory Visit: Payer: Self-pay

## 2023-06-29 ENCOUNTER — Other Ambulatory Visit: Payer: Self-pay | Admitting: Student in an Organized Health Care Education/Training Program

## 2023-06-29 ENCOUNTER — Other Ambulatory Visit: Payer: Self-pay

## 2023-06-30 ENCOUNTER — Other Ambulatory Visit: Payer: Self-pay

## 2023-06-30 MED ORDER — AMLODIPINE BESYLATE 5 MG PO TABS
5.0000 mg | ORAL_TABLET | Freq: Every day | ORAL | 11 refills | Status: DC
  Filled 2023-06-30: qty 30, 30d supply, fill #0

## 2023-07-13 ENCOUNTER — Other Ambulatory Visit: Payer: Self-pay

## 2023-07-16 IMAGING — CR DG CHEST 2V
2 series · 2 of 2 positions shown · non-contrast
Comparison: 02/04/2007

CLINICAL DATA: Chest pain, right shoulder pain

EXAM:
CHEST - 2 VIEW

[chest pa]
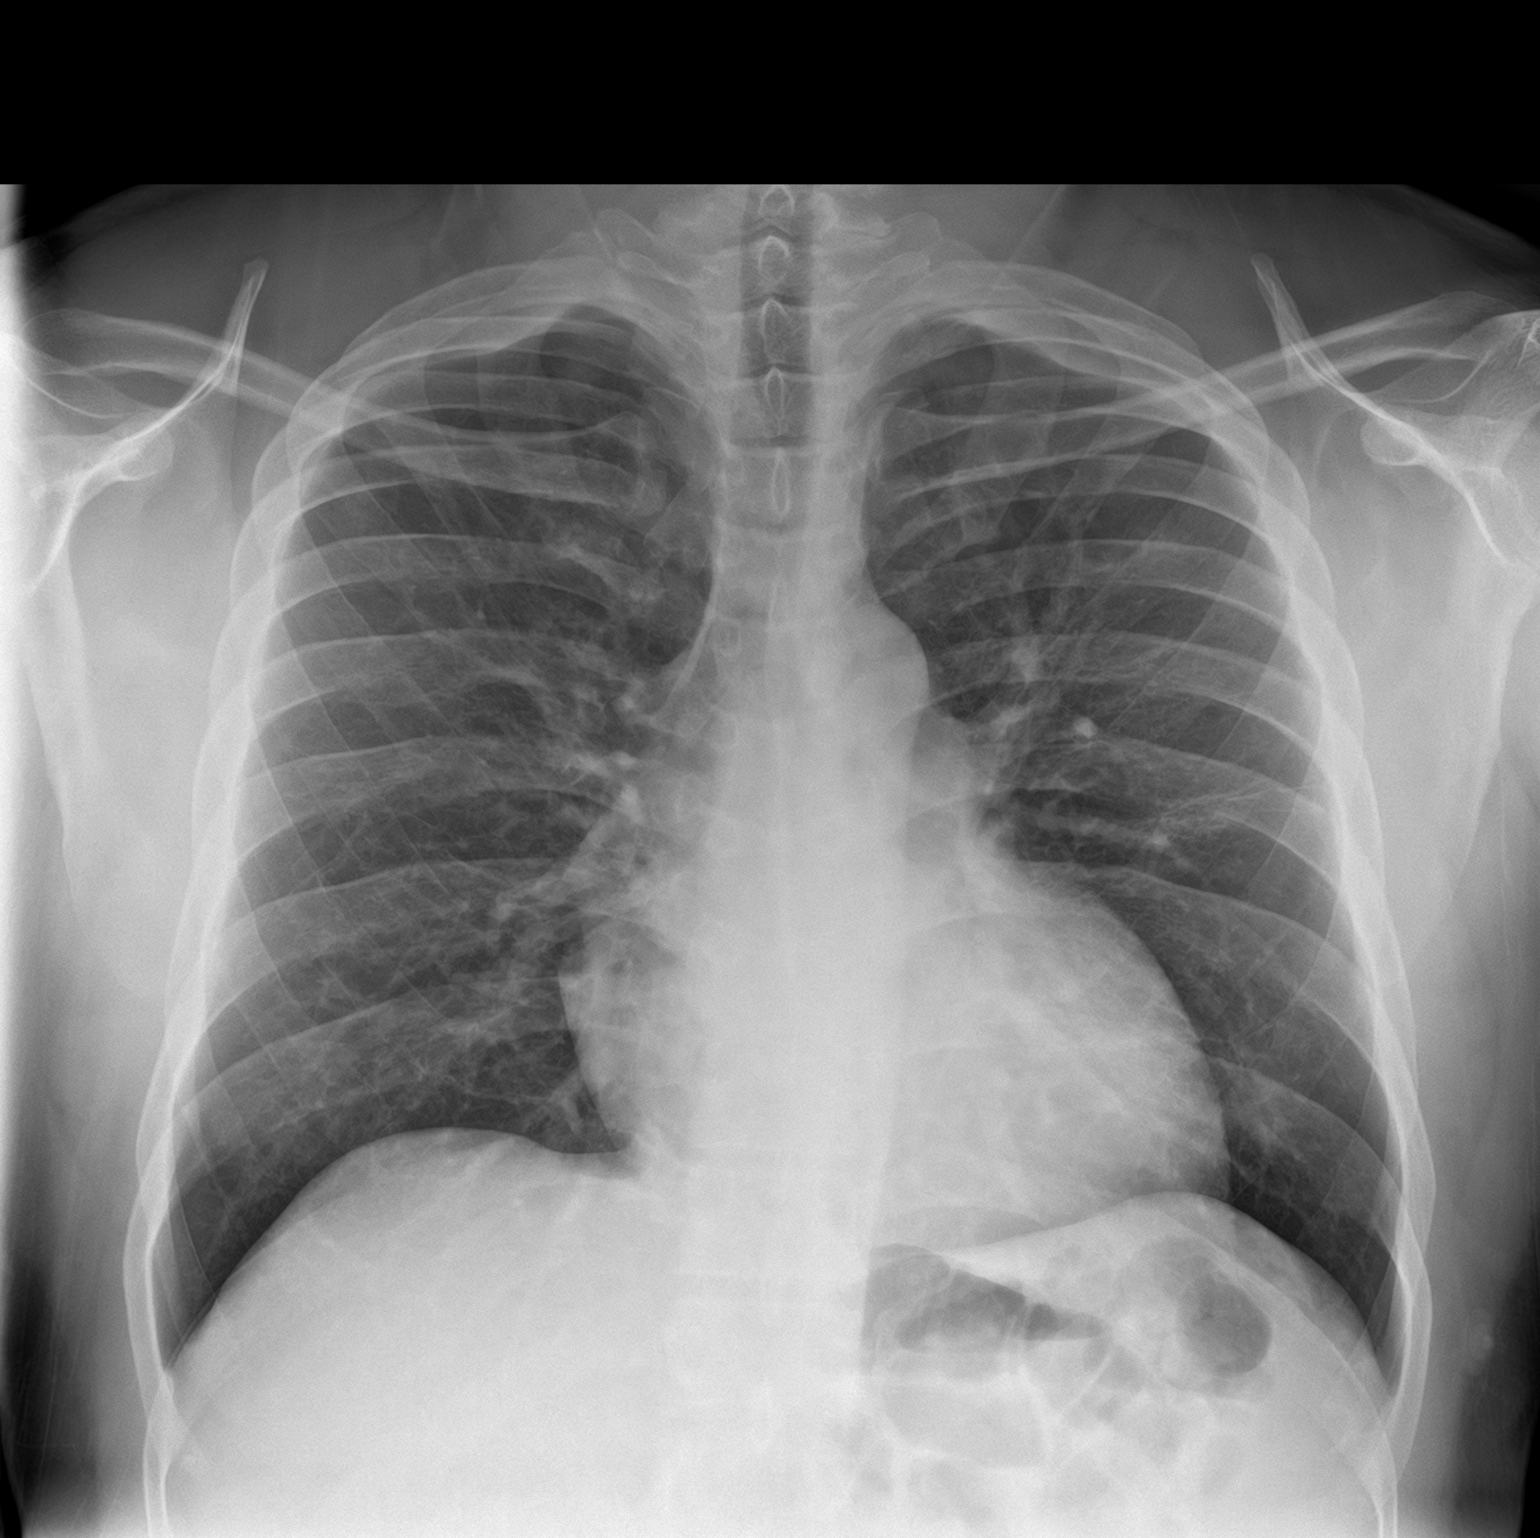

[chest lat]
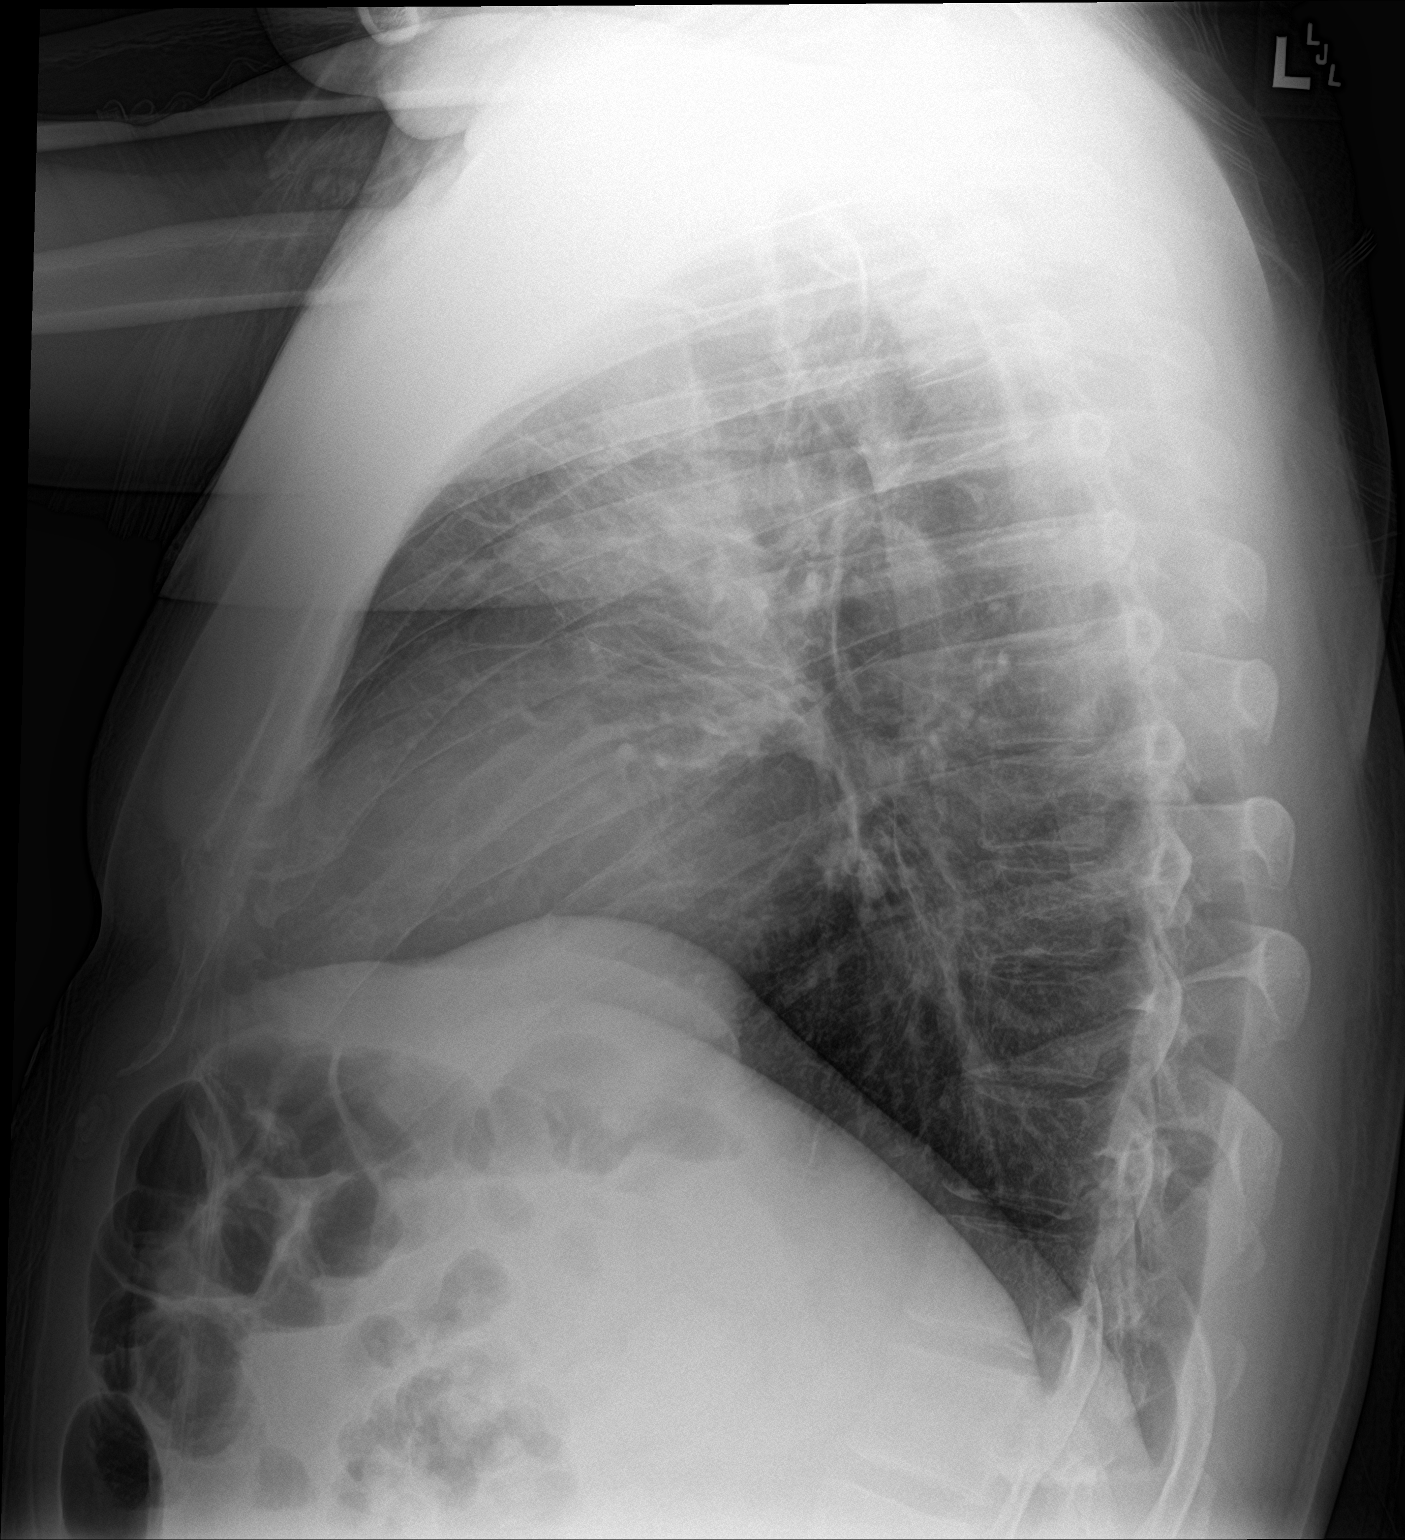

[2 of 2 positions shown; findings below may reference images not displayed]

FINDINGS: Cardiac size is within normal limits. There are small linear
densities in the parahilar regions, possibly suggesting scarring.
There are no signs of pulmonary edema or focal pulmonary
consolidation. There is no pleural effusion or pneumothorax.
IMPRESSION: Small linear densities in both parahilar regions may suggest
scarring. There are no signs of pulmonary edema or focal pulmonary
consolidation.

## 2023-09-25 ENCOUNTER — Emergency Department: Admission: EM | Admit: 2023-09-25 | Discharge: 2023-09-25 | Discharge status: Against Medical Advice | Payer: MEDICAID

## 2023-09-25 NOTE — ED Notes (Addendum)
Patient NA x 2 at multiple intervals by staff. Patient not seen in lobby by staff.

## 2023-09-25 NOTE — ED Notes (Signed)
2nd call no answer from lobby

## 2023-09-25 NOTE — ED Notes (Signed)
No answer from lobby  

## 2023-10-08 ENCOUNTER — Emergency Department: Payer: Medicaid Other

## 2023-10-08 ENCOUNTER — Other Ambulatory Visit: Payer: Self-pay

## 2023-10-08 ENCOUNTER — Emergency Department
Admission: EM | Admit: 2023-10-08 | Discharge: 2023-10-08 | Disposition: A | Discharge status: Home / Self Care | Payer: Medicaid Other | Attending: Emergency Medicine | Admitting: Emergency Medicine

## 2023-10-08 DIAGNOSIS — I10 Essential (primary) hypertension: Secondary | ICD-10-CM | POA: Diagnosis not present

## 2023-10-08 DIAGNOSIS — J069 Acute upper respiratory infection, unspecified: Secondary | ICD-10-CM | POA: Diagnosis not present

## 2023-10-08 DIAGNOSIS — R059 Cough, unspecified: Secondary | ICD-10-CM | POA: Diagnosis present

## 2023-10-08 DIAGNOSIS — U071 COVID-19: Secondary | ICD-10-CM | POA: Insufficient documentation

## 2023-10-08 LAB — RESP PANEL BY RT-PCR (RSV, FLU A&B, COVID)  RVPGX2
Influenza A by PCR: NEGATIVE
Influenza B by PCR: NEGATIVE
Resp Syncytial Virus by PCR: NEGATIVE
SARS Coronavirus 2 by RT PCR: POSITIVE — AB

## 2023-10-08 MED ORDER — BENZONATATE 100 MG PO CAPS
100.0000 mg | ORAL_CAPSULE | Freq: Two times a day (BID) | ORAL | 0 refills | Status: AC | PRN
  Filled 2023-10-08 – 2023-11-28 (×2): qty 14, 7d supply, fill #0

## 2023-10-08 MED ORDER — AMLODIPINE BESYLATE 5 MG PO TABS
5.0000 mg | ORAL_TABLET | Freq: Every day | ORAL | 2 refills | Status: DC
  Filled 2023-10-08 – 2023-11-03 (×2): qty 30, 30d supply, fill #0
  Filled 2023-11-28: qty 30, 30d supply, fill #1
  Filled 2023-12-28: qty 30, 30d supply, fill #2

## 2023-10-08 NOTE — ED Provider Notes (Signed)
[facility] Provider Note    Event Date/Time   First MD Initiated Contact with Patient 10/08/23 [phone]     (approximate)   History   Cough   HPI  Harold Jackson is a 37 y.o. male reports no major past medical history.  Hypertension  Has been experiencing a cough with slight congestion for about 1 week.  Symptoms been ongoing.  He overall feels okay no nausea no vomiting he has had some low-grade fevers.  Not short of breath no chest pain.  He reports he is just continue to have this lingering cough and congestion feels like "bronchitis"  No wheezing no shortness of breath  Discussed patient's blood pressure, patient reports that he ran out of his blood pressure medication prescription.     Physical Exam   Triage Vital Signs: ED Triage Vitals  Encounter Vitals Group     BP 10/08/23 0719 (!) 166/112     Systolic BP Percentile --      Diastolic BP Percentile --      Pulse Rate 10/08/23 0719 100     Resp 10/08/23 0719 20     Temp 10/08/23 0719 98.4 F (36.9 C)     Temp Source 10/08/23 0719 Oral     SpO2 10/08/23 0719 95 %     Weight --      Height --      Head Circumference --      Peak Flow --      Pain Score 10/08/23 0717 6     Pain Loc --      Pain Education --      Exclude from Growth Chart --     Most recent vital signs: Vitals:   10/08/23 0719  BP: (!) 166/112  Pulse: 100  Resp: 20  Temp: 98.4 F (36.9 C)  SpO2: 95%     General: Awake, no distress.  CV:  Good peripheral perfusion.  Normal tones and rate Resp:  Normal effort.  Clear bilateral.  Occasional cough.  No distress.  Normal work of breathing.  No wheezing Abd:  No distention.  Other:  No lower extremity edema   ED Results / Procedures / Treatments   Labs (all labs ordered are listed, but only abnormal results are displayed) Labs Reviewed  RESP PANEL BY RT-PCR (RSV, FLU A&B, COVID)  [id number] - Abnormal; Notable for the following components:      Result Value    SARS Coronavirus 2 by RT PCR POSITIVE (*)    All other components within normal limits     EKG     RADIOLOGY  Chest x-ray interpreted by me as negative for acute finding   PROCEDURES:  Critical Care performed: No  Procedures   MEDICATIONS ORDERED IN ED: Medications - No data to display   IMPRESSION / MDM / ASSESSMENT AND PLAN / ED COURSE  I reviewed the triage vital signs and the nursing notes.                              Labs interpreted as positive COVID-19  Differential diagnosis includes, but is not limited to, COVID-19, viral illness upper respiratory infection, bronchitis, pneumonia, etc.  No signs or symptoms of ACS.  No chest pain no signs or symptoms of acute hypoxia or respiratory distress.    Patient's presentation is most consistent with acute complicated illness / injury requiring diagnostic workup.   ----------------------------------------- 8:36  AM on 10/08/2023 ----------------------------------------- Patient advises he needs a another prescription for blood pressure medicine.  He has run out, or not had it refilled.  Will send a new prescription to his pharmacy.  Appears he had multiple refills ordered, but nonetheless will send new prescription.  He is completely asymptomatic to hypertension  Tessalon Perles prescribed.  Careful return precautions advised.  Return precautions and treatment recommendations and follow-up discussed with the patient who is agreeable with the plan.        FINAL CLINICAL IMPRESSION(S) / ED DIAGNOSES   Final diagnoses:  Viral URI with cough  COVID-19  Uncontrolled hypertension     Rx / DC Orders   ED Discharge Orders          Ordered    amLODipine (NORVASC) 5 MG tablet  Daily        10/08/23 0833    benzonatate (TESSALON PERLES) 100 MG capsule  2 times daily PRN        10/08/23 0833    Ambulatory Referral to Primary Care (Establish Care)       Comments: Uncontrolled hypertension   10/08/23 2956              Note:  This document was prepared using Dragon voice recognition software and may include unintentional dictation errors.   Sharyn Creamer, MD 10/08/23 [phone]

## 2023-10-08 NOTE — ED Triage Notes (Addendum)
Pt to ED via POV from home. Pt reports cough and sore throat since Friday. Pt reports CP from coughing. Pt reports

## 2023-10-13 ENCOUNTER — Encounter: Payer: Self-pay | Admitting: Emergency Medicine

## 2023-10-13 ENCOUNTER — Other Ambulatory Visit: Payer: Self-pay

## 2023-10-13 ENCOUNTER — Emergency Department
Admission: EM | Admit: 2023-10-13 | Discharge: 2023-10-13 | Disposition: A | Discharge status: Home / Self Care | Payer: Medicaid Other | Attending: Emergency Medicine | Admitting: Emergency Medicine

## 2023-10-13 DIAGNOSIS — K047 Periapical abscess without sinus: Secondary | ICD-10-CM | POA: Insufficient documentation

## 2023-10-13 DIAGNOSIS — I10 Essential (primary) hypertension: Secondary | ICD-10-CM | POA: Diagnosis not present

## 2023-10-13 DIAGNOSIS — K0889 Other specified disorders of teeth and supporting structures: Secondary | ICD-10-CM | POA: Diagnosis present

## 2023-10-13 MED ORDER — AMOXICILLIN 500 MG PO CAPS
500.0000 mg | ORAL_CAPSULE | Freq: Once | ORAL | Status: AC
  Administered 2023-10-13: 500 mg via ORAL
  Filled 2023-10-13: qty 1

## 2023-10-13 MED ORDER — AMOXICILLIN 500 MG PO CAPS: 500.0000 mg | ORAL_CAPSULE | Freq: Three times a day (TID) | ORAL | 0 refills | Status: DC

## 2023-10-13 NOTE — Discharge Instructions (Addendum)
OPTIONS FOR DENTAL FOLLOW UP CARE ° °[place] Department of Health and Human Services - Local Safety Net Dental Clinics °http://www.ncdhhs.gov/dph/oralhealth/services/safetynetclinics.htm °  °[facility] (336-562-3123) ° °[facility] [place] (919-933-9087) ° °[facility] (919-663-1744 ext [phone]) ° °[facility] ([phone]) ° °[facility] ([phone]) °This clinic caters to the indigent population and is on a lottery system. °Location: °[facility], [address], [place] °Clinic Hours: °Wednesdays from 6pm - 9pm, patients seen by a lottery system. °For dates, call or go to www.med.[facility].edu/[facility]/patients/Dental-[facility] °Services: °Cleanings, fillings and simple extractions. °Payment Options: °DENTAL WORK IS FREE OF CHARGE. Bring proof of income or support. °Best way to get seen: °Arrive at 5:15 pm - this is a lottery, NOT first come/first serve, so arriving earlier will not increase your chances of being seen. °  °  °[facility] °[phone] °Select option 1 for emergencies °  °Location: °[facility], [address], [place] °Clinic Hours: °No walk-ins accepted - call the day before to schedule an appointment. °Check in times are 9:30 am and 1:30 pm. °Services: °Simple extractions, temporary fillings, pulpectomy/pulp debridement, uncomplicated abscess drainage. °Payment Options: °PAYMENT IS DUE AT THE TIME OF SERVICE.  Fee is usually $100-200, additional surgical procedures (e.g. abscess drainage) may be extra. °Cash, checks, Visa/MasterCard accepted.  Can file Medicaid if patient is covered for dental - patient should call case worker to check. °No discount for [facility] patients. °Best way to get seen: °MUST call the day before and get onto the schedule. Can usually be seen the next 1-2 days. No walk-ins accepted. °  °  °[facility] °[phone] °   °Location: °[facility], [address], [place] °Clinic Hours: °M, W, Th, F 8am or 1:30pm, Tues 9a or 1:30 - first come/first served. °Services: °Simple extractions, temporary fillings, uncomplicated abscess drainage.  You do not need to be an [place] resident. °Payment Options: °PAYMENT IS DUE AT THE TIME OF SERVICE. °Dental insurance, otherwise sliding scale - bring proof of income or support. °Depending on income and treatment needed, cost is usually $50-200. °Best way to get seen: °Arrive early as it is first come/first served. °  °  °[facility] °[phone] °  °Location: °[address] °Clinic Hours: °Mon-Thu 8a-5p °Services: °Most basic dental services including extractions and fillings. °Payment Options: °PAYMENT IS DUE AT THE TIME OF SERVICE. °Sliding scale, up to 50% off - bring proof if income or support. °Medicaid with dental option accepted. °Best way to get seen: °Call to schedule an appointment, can usually be seen within 2 weeks OR they will try to see walk-ins - show up at 8a or 2p (you may have to wait). °  °  °[facility] °[phone] °[place] RESIDENTS ONLY °  °Location: °[facility], [address], [place], [place] [address] °Clinic Hours: By appointment only. °Monday - Thursday 8am-5pm, Friday 8am-12pm °Services: Cleanings, fillings, extractions. °Payment Options: °PAYMENT IS DUE AT THE TIME OF SERVICE. °Cash, Visa or MasterCard. Sliding scale - $30 minimum per service. °Best way to get seen: °Come in to office, complete packet and make an appointment - need proof of income °or support monies for each household member and proof of [place] residence. °Usually takes about a month to get in. °  °  °[facility] °[phone] °  °Location: °[address]   [place] °Clinic Hours: Walk-in Urgent Care Dental Services are offered Monday-Friday  mornings only. °The numbers of emergencies accepted daily is limited to the number of °providers available. °Maximum 15 - Mondays, Wednesdays & Thursdays °Maximum 10 - Tuesdays & Fridays °Services: °You do not need to be a [place] resident to be seen for a dental emergency. °Emergencies are defined as pain, swelling, abnormal bleeding, or dental trauma. Walkins will receive x-rays if needed. °NOTE: Dental cleaning is not an emergency. °Payment Options: °PAYMENT IS DUE AT THE TIME OF SERVICE. °Minimum co-pay is $40.00 for uninsured patients. °Minimum co-pay is $3.00 for Medicaid with dental coverage. °Dental Insurance is accepted and must be presented at time of visit. °Medicare does not cover dental. °Forms of payment: Cash, credit card, checks. °Best way to get seen: °If not previously registered with the clinic, walk-in dental registration begins at 7:15 am and is on a first come/first serve basis. °If previously registered with the clinic, call to make an appointment. °  °  °[facility] °[phone] °[place] RESIDENTS ONLY °  °Location: °[address], [place], [place] °Clinic Hours: °Mon-Thu 10a-2p °Services: Extractions only! °Payment Options: °FREE (donations accepted) - bring proof of income or support °Best way to get seen: °Call and schedule an appointment OR come at 8am on the 1st Monday of every month (except for holidays) when it is first come/first served. °  °  °[facility] °[phone] °  °Location: °[address], [place] °Clinic Hours: °Friday mornings °Services, Payment Options, Best way to get seen: °Call for info °

## 2023-10-13 NOTE — ED Provider Notes (Cosign Needed)
[facility] Provider Note    Event Date/Time   First MD Initiated Contact with Patient 10/13/23 [phone]     (approximate)   History   Dental Pain   HPI  Harold Jackson is a 37 y.o. male with history of hypertension presents emergency department complaint of right-sided facial pain and swelling.  Patient not take his blood pressure medicine this morning.  States symptoms started morning.  States he had an abscess in the area but it got smaller.  Has still not seen a dentist.      Physical Exam   Triage Vital Signs: ED Triage Vitals  Encounter Vitals Group     BP 10/13/23 0739 (!) 182/120     Systolic BP Percentile --      Diastolic BP Percentile --      Pulse Rate 10/13/23 0739 89     Resp 10/13/23 0739 18     Temp 10/13/23 0739 98.1 F (36.7 C)     Temp Source 10/13/23 0739 Oral     SpO2 10/13/23 0739 96 %     Weight 10/13/23 0732 244 lb 14.9 oz (111.1 kg)     Height 10/13/23 0732 6\' 3"  (1.905 m)     Head Circumference --      Peak Flow --      Pain Score 10/13/23 0732 7     Pain Loc --      Pain Education --      Exclude from Growth Chart --     Most recent vital signs: Vitals:   10/13/23 0739  BP: (!) 182/120  Pulse: 89  Resp: 18  Temp: 98.1 F (36.7 C)  SpO2: 96%     General: Awake, no distress.   CV:  Good peripheral perfusion. regular rate and  rhythm Resp:  Normal effort.  Abd:  No distention.   Other:  Small amount of swelling noted in the right maxillary side, neck supple, no lymphadenopathy   ED Results / Procedures / Treatments   Labs (all labs ordered are listed, but only abnormal results are displayed) Labs Reviewed - No data to display   EKG     RADIOLOGY     PROCEDURES:   Procedures   MEDICATIONS ORDERED IN ED: Medications  amoxicillin (AMOXIL) capsule 500 mg (has no administration in time range)     IMPRESSION / MDM / ASSESSMENT AND PLAN / ED COURSE  I reviewed the triage vital signs and  the nursing notes.                              Differential diagnosis includes, but is not limited to, dental abscess, sinusitis, cellulitis  Patient's presentation is most consistent with acute, uncomplicated illness.   Exam is consistent with acute dental abscess.  Will place him on amoxicillin.  Given a dose amoxicillin here in the ED.  Follow-up with the dental clinic.  Take his blood pressure medicine when he gets home.  Return if worsening.  Discharged stable condition.      FINAL CLINICAL IMPRESSION(S) / ED DIAGNOSES   Final diagnoses:  Dental abscess     Rx / DC Orders   ED Discharge Orders          Ordered    amoxicillin (AMOXIL) 500 MG capsule  3 times daily        10/13/23 9604  Note:  This document was prepared using Dragon voice recognition software and may include unintentional dictation errors.    Faythe Ghee, PA-C 10/13/23 0830

## 2023-10-13 NOTE — ED Triage Notes (Signed)
Right upper jaw dental pain /abscess x 1 week. This morning more swelling and throbbing.

## 2023-10-13 NOTE — ED Notes (Signed)
See triage note  Presents with possible dental abscess  Swelling to upper gum line  Sxs' started about 1 week ago

## 2023-10-15 ENCOUNTER — Other Ambulatory Visit: Payer: Self-pay

## 2023-10-15 MED ORDER — AMOXICILLIN 500 MG PO CAPS
500.0000 mg | ORAL_CAPSULE | Freq: Three times a day (TID) | ORAL | 0 refills | Status: DC
  Filled 2023-10-15 – 2023-11-03 (×2): qty 30, 10d supply, fill #0

## 2023-10-26 ENCOUNTER — Other Ambulatory Visit: Payer: Self-pay

## 2023-11-03 ENCOUNTER — Other Ambulatory Visit: Payer: Self-pay

## 2023-11-28 ENCOUNTER — Other Ambulatory Visit: Payer: Self-pay | Admitting: Emergency Medicine

## 2023-11-28 ENCOUNTER — Other Ambulatory Visit: Payer: Self-pay

## 2023-11-28 ENCOUNTER — Other Ambulatory Visit: Payer: Self-pay | Admitting: Physician Assistant

## 2023-11-29 ENCOUNTER — Other Ambulatory Visit: Payer: Self-pay

## 2023-12-28 ENCOUNTER — Other Ambulatory Visit: Payer: Self-pay | Admitting: Emergency Medicine

## 2023-12-28 ENCOUNTER — Other Ambulatory Visit: Payer: Self-pay | Admitting: Physician Assistant

## 2023-12-29 ENCOUNTER — Other Ambulatory Visit: Payer: Self-pay

## 2023-12-29 NOTE — Telephone Encounter (Signed)
I am not the patient's provider.

## 2023-12-30 ENCOUNTER — Other Ambulatory Visit: Payer: Self-pay

## 2024-01-03 ENCOUNTER — Other Ambulatory Visit: Payer: Self-pay

## 2024-01-03 ENCOUNTER — Other Ambulatory Visit: Payer: Self-pay | Admitting: Emergency Medicine

## 2024-01-03 ENCOUNTER — Other Ambulatory Visit: Payer: Self-pay | Admitting: Physician Assistant

## 2024-01-11 ENCOUNTER — Other Ambulatory Visit: Payer: Self-pay

## 2024-06-02 ENCOUNTER — Encounter: Payer: Self-pay | Admitting: Emergency Medicine

## 2024-06-02 ENCOUNTER — Other Ambulatory Visit: Payer: Self-pay

## 2024-06-02 ENCOUNTER — Emergency Department: Admission: EM | Admit: 2024-06-02 | Discharge: 2024-06-03 | Disposition: A | Discharge status: Home / Self Care

## 2024-06-02 DIAGNOSIS — I10 Essential (primary) hypertension: Secondary | ICD-10-CM | POA: Insufficient documentation

## 2024-06-02 DIAGNOSIS — J45909 Unspecified asthma, uncomplicated: Secondary | ICD-10-CM | POA: Diagnosis not present

## 2024-06-02 DIAGNOSIS — W01198A Fall on same level from slipping, tripping and stumbling with subsequent striking against other object, initial encounter: Secondary | ICD-10-CM | POA: Diagnosis not present

## 2024-06-02 DIAGNOSIS — R55 Syncope and collapse: Secondary | ICD-10-CM | POA: Diagnosis present

## 2024-06-02 DIAGNOSIS — M542 Cervicalgia: Secondary | ICD-10-CM | POA: Insufficient documentation

## 2024-06-02 DIAGNOSIS — S0003XA Contusion of scalp, initial encounter: Secondary | ICD-10-CM | POA: Insufficient documentation

## 2024-06-02 DIAGNOSIS — R7989 Other specified abnormal findings of blood chemistry: Secondary | ICD-10-CM | POA: Insufficient documentation

## 2024-06-02 LAB — COMPREHENSIVE METABOLIC PANEL WITH GFR
ALT: 27 U/L (ref 0–44)
AST: 46 U/L — ABNORMAL HIGH (ref 15–41)
Albumin: 4.2 g/dL (ref 3.5–5.0)
Alkaline Phosphatase: 50 U/L (ref 38–126)
Anion gap: 13 (ref 5–15)
BUN: 16 mg/dL (ref 6–20)
CO2: 19 mmol/L — ABNORMAL LOW (ref 22–32)
Calcium: 9.1 mg/dL (ref 8.9–10.3)
Chloride: 106 mmol/L (ref 98–111)
Creatinine, Ser: 1.26 mg/dL — ABNORMAL HIGH (ref 0.61–1.24)
GFR, Estimated: >60 mL/min (ref >60)
Glucose, Bld: 106 mg/dL — ABNORMAL HIGH (ref 70–99)
Potassium: 3.6 mmol/L (ref 3.5–5.1)
Sodium: 138 mmol/L (ref 135–145)
Total Bilirubin: 0.9 mg/dL (ref 0.0–1.2)
Total Protein: 7.8 g/dL (ref 6.5–8.1)

## 2024-06-02 LAB — CBC
HCT: 47.2 % (ref 39.0–52.0)
Hemoglobin: 16.2 g/dL (ref 13.0–17.0)
MCH: 31.5 pg (ref 26.0–34.0)
MCHC: 34.3 g/dL (ref 30.0–36.0)
MCV: 91.8 fL (ref 80.0–100.0)
Platelets: 203 K/uL (ref 150–400)
RBC: 5.14 MIL/uL (ref 4.22–5.81)
RDW: 12.3 % (ref 11.5–15.5)
WBC: 6.5 K/uL (ref 4.0–10.5)
nRBC: 0 % (ref 0.0–0.2)

## 2024-06-02 NOTE — ED Triage Notes (Signed)
 Bib ems from home for syncopal episode, did fall and hit back of head on pavement.  Per ems unsure how long he was unconscious, per fire he was out when they arrived and he became alert on their arrival.  Pt a&ox4, nadn

## 2024-06-03 ENCOUNTER — Emergency Department

## 2024-06-03 LAB — TROPONIN I (HIGH SENSITIVITY)
Troponin I (High Sensitivity): 19 ng/L — ABNORMAL HIGH (ref <18)
Troponin I (High Sensitivity): 18 ng/L — ABNORMAL HIGH (ref <18)

## 2024-06-03 MED ORDER — ACETAMINOPHEN 500 MG PO TABS
1000.0000 mg | ORAL_TABLET | Freq: Once | ORAL | Status: AC
  Administered 2024-06-03: 1000 mg via ORAL
  Filled 2024-06-03: qty 2

## 2024-06-03 NOTE — Discharge Instructions (Addendum)
 Your evaluation in the emergency department was overall reassuring.  Please follow-up with your primary care provider for reevaluation, and return to the emergency department with any new or worsening symptoms.  Be sure to drink plenty of fluids daily, I recommend sitting or lying down if you feel lightheaded in the future.

## 2024-06-03 NOTE — ED Provider Notes (Signed)
 [facility] Provider Note    Event Date/Time   First MD Initiated Contact with Patient 06/02/24 2301     (approximate)   History   Near Syncope  Bib ems from home for syncopal episode, did fall and hit back of head on pavement.  Per ems unsure how long he was unconscious, per fire he was out when they arrived and he became alert on their arrival.  Pt a&ox4, nadn   HPI Harold Jackson is a 38 y.o. male pmh asthma, hypertension presents for evaluation of a loss of consciousness episode - Patient was at a cookout, sitting in the picnic table, was feeling hot, and had a loss of consciousness episode.  Witnessed by bystanders, no reported convulsions.  Loss of consciousness likely about 5 minutes.  Reportedly fell back and hit the back of his head on the ground.  Not on blood thinners.  Does complain of some headache currently. -Per EMS, unconscious on their arrival, regained consciousness with them -Denies any preceding chest pain or shortness of breath.  No recent leg swelling, no surgery/lesion/travel, no history of DVT/PE. -Does note he does have a history of prior syncopal episodes, notes it is frequently when he is out in the heat, states last episode was about 1 month ago     Physical Exam   Triage Vital Signs: ED Triage Vitals  Encounter Vitals Group     BP 06/02/24 2301 (!) 140/91     Girls Systolic BP Percentile --      Girls Diastolic BP Percentile --      Boys Systolic BP Percentile --      Boys Diastolic BP Percentile --      Pulse Rate 06/02/24 2301 87     Resp 06/02/24 2301 17     Temp 06/02/24 2301 98.5 F (36.9 C)     Temp Source 06/02/24 2301 Oral     SpO2 06/02/24 2301 95 %     Weight 06/02/24 2301 260 lb (117.9 kg)     Height 06/02/24 2301 6' 3 (1.905 m)     Head Circumference --      Peak Flow --      Pain Score 06/02/24 2331 2     Pain Loc --      Pain Education --      Exclude from Growth Chart --     Most recent  vital signs: Vitals:   06/03/24 0300 06/03/24 0600  BP: (!) 152/101 (!) 168/112  Pulse: 85 87  Resp: (!) 21 (!) 22  Temp:  98.7 F (37.1 C)  SpO2: 98% 94%     General: Awake, no distress.  HEENT:  + Occiput hematoma with mild overlying abrasion, no other external evidence of head trauma.  No midline neck pain, mild right paraspinal cervical tenderness.  Full range of motion of neck without difficulty. CV:  Good peripheral perfusion. RRR, RP 2+ Resp:  Normal effort. CTAB Abd:  No distention. Nontender to deep palpation throughout Other:  No significant tenderness to palpation throughout bilateral upper and lower extremities.  Full range of motion of all joints.   ED Results / Procedures / Treatments   Labs (all labs ordered are listed, but only abnormal results are displayed) Labs Reviewed  COMPREHENSIVE METABOLIC PANEL WITH GFR - Abnormal; Notable for the following components:      Result Value   CO2 19 (*)    Glucose, Bld 106 (*)    Creatinine, Ser 1.26 (*)  AST 46 (*)    All other components within normal limits  TROPONIN I (HIGH SENSITIVITY) - Abnormal; Notable for the following components:   Troponin I (High Sensitivity) 19 (*)    All other components within normal limits  TROPONIN I (HIGH SENSITIVITY) - Abnormal; Notable for the following components:   Troponin I (High Sensitivity) 18 (*)    All other components within normal limits  CBC  CBG MONITORING, ED     EKG  See ED course below.   RADIOLOGY Radiology interpreted myself and radiology report reviewed.  No acute pathology identified.    PROCEDURES:  Critical Care performed: No  Procedures   MEDICATIONS ORDERED IN ED: Medications  acetaminophen  (TYLENOL ) tablet 1,000 mg (1,000 mg Oral Given 06/03/24 9687)     IMPRESSION / MDM / ASSESSMENT AND PLAN / ED COURSE  I reviewed the triage vital signs and the nursing notes.                              DDX/MDM/AP: Differential diagnosis  includes, but is not limited to, vasovagal episode, consider transient arrhythmia, do not clinically suspect PE (PERC negative), highly doubt ACS, consider underlying electrolyte abnormality or anemia.  No history to suggest seizure.  With regard to head trauma, consider skull fracture intracranial hemorrhage.  C-spine cleared clinically.  Plan: - Labs - Tylenol  -n.p.o. - EKG -CT head - Reassess  Patient's presentation is most consistent with acute presentation with potential threat to life or bodily function.  The patient is on the cardiac monitor to evaluate for evidence of arrhythmia and/or significant heart rate changes.  ED course below.  Workup unremarkable including serial troponins.  No recurrence of symptoms.  Overall suspect vasovagal episode in the setting of feeling warm, suspect alcohol intoxication may have contributed to presentation as well.  Clinically sober at this time, stable for discharge home.  Plan for PD follow-up.  ED return precautions in place.  Patient agrees with plan.  Clinical Course as of 06/03/24 0616  Sat Jun 03, 2024  0247 CMP reviewed, somewhat low bicarb, creatinine at baseline, otherwise unremarkable [MM]  0247 CBC reviewed, unremarkable [MM]  0409 Troponin mildly elevated, within baseline, will trend to ensure not rising [MM]  0410 CTAP: IMPRESSION: 1. No acute intracranial abnormality.   [MM]  [phone] Ecg = sinus rhythm, rate 81,.  Early repolarization in V2, trace ST depressions and T wave inversions noted in inferior leads and V5-V6, normal axis, normal intervals.  No evidence of Brugada, WPW, significant prolongation of QT.  Similar to prior EKG. [MM]    Clinical Course User Index [MM] Clarine Ozell LABOR, MD     FINAL CLINICAL IMPRESSION(S) / ED DIAGNOSES   Final diagnoses:  Syncope, unspecified syncope type     Rx / DC Orders   ED Discharge Orders     None        Note:  This document was prepared using Dragon voice recognition  software and may include unintentional dictation errors.   Clarine Ozell LABOR, MD 06/03/24 [phone]

## 2024-06-11 ENCOUNTER — Other Ambulatory Visit: Payer: Self-pay

## 2024-06-11 ENCOUNTER — Encounter: Payer: Self-pay | Admitting: Emergency Medicine

## 2024-06-11 ENCOUNTER — Emergency Department

## 2024-06-11 ENCOUNTER — Emergency Department
Admission: EM | Admit: 2024-06-11 | Discharge: 2024-06-11 | Disposition: A | Discharge status: Home / Self Care | Attending: Emergency Medicine | Admitting: Emergency Medicine

## 2024-06-11 DIAGNOSIS — J45909 Unspecified asthma, uncomplicated: Secondary | ICD-10-CM | POA: Insufficient documentation

## 2024-06-11 DIAGNOSIS — W01198A Fall on same level from slipping, tripping and stumbling with subsequent striking against other object, initial encounter: Secondary | ICD-10-CM | POA: Diagnosis not present

## 2024-06-11 DIAGNOSIS — I10 Essential (primary) hypertension: Secondary | ICD-10-CM | POA: Insufficient documentation

## 2024-06-11 DIAGNOSIS — M25519 Pain in unspecified shoulder: Secondary | ICD-10-CM | POA: Insufficient documentation

## 2024-06-11 DIAGNOSIS — M542 Cervicalgia: Secondary | ICD-10-CM | POA: Insufficient documentation

## 2024-06-11 MED ORDER — CLONIDINE HCL 0.1 MG PO TABS
0.1000 mg | ORAL_TABLET | Freq: Once | ORAL | Status: AC
  Administered 2024-06-11: 0.1 mg via ORAL
  Filled 2024-06-11: qty 1

## 2024-06-11 MED ORDER — AMLODIPINE BESYLATE 10 MG PO TABS
10.0000 mg | ORAL_TABLET | Freq: Every day | ORAL | 2 refills | Status: DC
  Filled 2024-06-11: qty 30, 30d supply, fill #0

## 2024-06-11 MED ORDER — TIZANIDINE HCL 4 MG PO TABS
4.0000 mg | ORAL_TABLET | Freq: Three times a day (TID) | ORAL | 0 refills | Status: AC | PRN
  Filled 2024-06-11: qty 15, 5d supply, fill #0

## 2024-06-11 MED ORDER — TIZANIDINE HCL 4 MG PO TABS
4.0000 mg | ORAL_TABLET | Freq: Once | ORAL | Status: AC
  Administered 2024-06-11: 4 mg via ORAL
  Filled 2024-06-11: qty 1

## 2024-06-11 NOTE — ED Triage Notes (Addendum)
 Patient reports neck pain since injury on 8/15. Reports he had syncopal episode and hit his head - was evaluated here and CT scans were clear but patient states pain is still there. Tachy and hypertensive in triage - reports that he just walked here from apartment and has missed several doses of his blood pressure medication.

## 2024-06-11 NOTE — ED Provider Notes (Signed)
 [facility] Emergency Department Provider Note     Event Date/Time   First MD Initiated Contact with Patient 06/11/24 2153     (approximate)   History   Neck Injury   HPI  Harold Jackson is a 38 y.o. male with a history of asthma, HTN, and single episode, presents to the ED for evaluation of ongoing neck pain following recent syncopal episode.  Patient presented to the ED here on August 15, after he fell from a seated position at an outdoor cookout.  He apparently fell and hit in the back of his head, and had a witnessed LOC duration of approximately 5 minutes.  Patient regained consciousness spontaneously while EMS were on scene.  Chart review reveals patient had a normal head CT at that time.  No record of any cervical spine CT related to that visit.  Who presents to the ED endorsing ongoing neck pain.  Patient presents tachycardic and hypertensive via POV, reports that he did not take his blood pressure medicine for the last several days.  He denies any frank chest pain, shortness of breath, paralysis, weakness, or syncope.  Physical Exam   Triage Vital Signs: ED Triage Vitals  Encounter Vitals Group     BP 06/11/24 2136 (!) 196/139     Girls Systolic BP Percentile --      Girls Diastolic BP Percentile --      Boys Systolic BP Percentile --      Boys Diastolic BP Percentile --      Pulse Rate 06/11/24 2136 (!) 113     Resp 06/11/24 2136 20     Temp 06/11/24 2136 97.7 F (36.5 C)     Temp src --      SpO2 06/11/24 2136 100 %     Weight 06/11/24 2134 145 lb (65.8 kg)     Height 06/11/24 2134 6' 3 (1.905 m)     Head Circumference --      Peak Flow --      Pain Score 06/11/24 2134 7     Pain Loc --      Pain Education --      Exclude from Growth Chart --     Most recent vital signs: Vitals:   06/11/24 2259 06/11/24 2344  BP: (!) 193/133 (!) 171/122  Pulse: 98   Resp: 17   Temp: 97.7 F (36.5 C)   SpO2: 99%     General Awake, no  distress. NAD HEENT NCAT. PERRL. EOMI. No rhinorrhea. Mucous membranes are moist. CV:  Good peripheral perfusion. RRR RESP:  Normal effort. CTA MSK:  Normal spinal alignment without midline tenderness, spasm, deformity, or step-off.  AROM of all extremities NEURO: Cranial nerves II to XII grossly intact   ED Results / Procedures / Treatments   Labs (all labs ordered are listed, but only abnormal results are displayed) Labs Reviewed - No data to display   EKG   RADIOLOGY  I personally viewed and evaluated these images as part of my medical decision making, as well as reviewing the written report by the radiologist.  ED Provider Interpretation: No acute fracture or listhesis  CT Cervical Spine Wo Contrast Result Date: 06/11/2024 CLINICAL DATA:  Neck trauma, dangerous injury mechanism (Age 71-64y) Reports he had syncopal episode and hit his head. EXAM: CT CERVICAL SPINE WITHOUT CONTRAST TECHNIQUE: Multidetector CT imaging of the cervical spine was performed without intravenous contrast. Multiplanar CT image reconstructions were also generated. RADIATION DOSE REDUCTION:  This exam was performed according to the departmental dose-optimization program which includes automated exposure control, adjustment of the mA and/or kV according to patient size and/or use of iterative reconstruction technique. COMPARISON:  CT C-spine 05/07/2023 FINDINGS: Alignment: Reversal normal cervical lordosis likely due to positioning. Skull base and vertebrae: Multilevel mild degenerative changes spine. No acute fracture. No aggressive appearing focal osseous lesion or focal pathologic process. Soft tissues and spinal canal: No prevertebral fluid or swelling. No visible canal hematoma. Upper chest: Redemonstration of right apical paraseptal emphysematous changes. No definite pneumothorax. Other: None. IMPRESSION: No acute displaced fracture or traumatic listhesis of the cervical spine. Electronically Signed   By:  Morgane  Naveau M.D.   On: 06/11/2024 22:12    PROCEDURES:  Critical Care performed: No  Procedures   MEDICATIONS ORDERED IN ED: Medications  cloNIDine  (CATAPRES ) tablet 0.1 mg (0.1 mg Oral Given 06/11/24 2249)  tiZANidine  (ZANAFLEX ) tablet 4 mg (4 mg Oral Given 06/11/24 2309)     IMPRESSION / MDM / ASSESSMENT AND PLAN / ED COURSE  I reviewed the triage vital signs and the nursing notes.                              Differential diagnosis includes, but is not limited to, strain, myalgia, torticollis, radiculopathy, fracture  Patient's presentation is most consistent with acute complicated illness / injury requiring diagnostic workup.  Patient's diagnosis is consistent with subsequent visit for neck pain following a syncopal fall.  Patient's exam is overall reassuring without any evidence of any acute neuromuscular deficits.  CT images interpreted by me, show no acute fracture or listhesis.  Patient with poorly controlled blood pressure with poor medication compliance by his report.  He is given a dose of clonidine  in the ED to help normalize his BP.  No reports or complaints of any headache, dizziness, blurry vision, chest pain at this time.  Patient will be discharged home with prescriptions for amlodipine  10 mg and tizanidine . Patient is to follow up with his primary provider and cardiology as previously referred, as needed or otherwise directed. Patient is given ED precautions to return to the ED for any worsening or new symptoms.  FINAL CLINICAL IMPRESSION(S) / ED DIAGNOSES   Final diagnoses:  Neck and shoulder pain  Uncontrolled hypertension     Rx / DC Orders   ED Discharge Orders          Ordered    tiZANidine  (ZANAFLEX ) 4 MG tablet  3 times daily PRN        06/11/24 2237    amLODipine  (NORVASC ) 10 MG tablet  Daily        06/11/24 2237             Note:  This document was prepared using Dragon voice recognition software and may include unintentional dictation  errors.    Loyd Candida LULLA Aldona, PA-C 06/11/24 2345    Viviann Pastor, MD 06/13/24 [phone]

## 2024-06-11 NOTE — Discharge Instructions (Addendum)
 Your exam and CT scan are normal and reassuring at this time with no signs of any serious neck fracture or dislocation or herniated disc related to your fall.  Your blood pressure is elevated and uncontrolled.  Take the prescription blood pressure medicine daily as prescribed.  Follow-up with [facility] to establish a primary care home.  Follow-up with cardiology as previously suggested for blood pressure management.

## 2024-06-12 ENCOUNTER — Other Ambulatory Visit: Payer: Self-pay

## 2024-06-22 ENCOUNTER — Other Ambulatory Visit: Payer: Self-pay

## 2024-09-12 ENCOUNTER — Other Ambulatory Visit: Payer: Self-pay

## 2024-09-12 ENCOUNTER — Emergency Department
Admission: EM | Admit: 2024-09-12 | Discharge: 2024-09-12 | Disposition: A | Discharge status: Home / Self Care | Attending: Emergency Medicine | Admitting: Emergency Medicine

## 2024-09-12 DIAGNOSIS — L0231 Cutaneous abscess of buttock: Secondary | ICD-10-CM | POA: Insufficient documentation

## 2024-09-12 DIAGNOSIS — J45909 Unspecified asthma, uncomplicated: Secondary | ICD-10-CM | POA: Insufficient documentation

## 2024-09-12 DIAGNOSIS — I10 Essential (primary) hypertension: Secondary | ICD-10-CM | POA: Insufficient documentation

## 2024-09-12 DIAGNOSIS — L0291 Cutaneous abscess, unspecified: Secondary | ICD-10-CM

## 2024-09-12 MED ORDER — DOXYCYCLINE HYCLATE 100 MG PO TABS: 100.0000 mg | ORAL_TABLET | Freq: Two times a day (BID) | ORAL | 0 refills | Status: AC

## 2024-09-12 MED ORDER — HYDROCODONE-ACETAMINOPHEN 5-325 MG PO TABS: 1.0000 | ORAL_TABLET | Freq: Three times a day (TID) | ORAL | 0 refills | Status: AC | PRN

## 2024-09-12 MED ORDER — AMLODIPINE BESYLATE 10 MG PO TABS: 10.0000 mg | ORAL_TABLET | Freq: Every day | ORAL | 3 refills | Status: AC

## 2024-09-12 MED ORDER — DOXYCYCLINE HYCLATE 100 MG PO TABS
100.0000 mg | ORAL_TABLET | Freq: Once | ORAL | Status: AC
  Administered 2024-09-12: 100 mg via ORAL
  Filled 2024-09-12: qty 1

## 2024-09-12 MED ORDER — HYDROCHLOROTHIAZIDE 25 MG PO TABS: 25.0000 mg | ORAL_TABLET | Freq: Every day | ORAL | 3 refills | Status: AC

## 2024-09-12 MED ORDER — LIDOCAINE HCL (PF) 1 % IJ SOLN
5.0000 mL | Freq: Once | INTRAMUSCULAR | Status: DC
  Filled 2024-09-12: qty 5

## 2024-09-12 NOTE — ED Provider Notes (Signed)
 [facility] Emergency Department Provider Note     Event Date/Time   First MD Initiated Contact with Patient 09/12/24 1423     (approximate)   History   Abscess   HPI  Harold Jackson is a 38 y.o. male with a history of asthma and hypertension, presents to the ED endorsing a boil to his left buttocks near the perineum.  Patient endorses it has been there for approximately 4 days.  Denies any spontaneous purulent drainage.  He denies any prior history of skin abscesses or boils.  No reports of any interim fevers, chills, or sweats.    Physical Exam   Triage Vital Signs: ED Triage Vitals  Encounter Vitals Group     BP 09/12/24 1359 (!) 204/134     Girls Systolic BP Percentile --      Girls Diastolic BP Percentile --      Boys Systolic BP Percentile --      Boys Diastolic BP Percentile --      Pulse Rate 09/12/24 1359 (!) 102     Resp 09/12/24 1359 16     Temp 09/12/24 1359 98.8 F (37.1 C)     Temp Source 09/12/24 1359 Oral     SpO2 09/12/24 1359 100 %     Weight 09/12/24 1357 145 lb 1 oz (65.8 kg)     Height --      Head Circumference --      Peak Flow --      Pain Score 09/12/24 1357 10     Pain Loc --      Pain Education --      Exclude from Growth Chart --     Most recent vital signs: Vitals:   09/12/24 1359 09/12/24 1632  BP: (!) 204/134 (!) 160/92  Pulse: (!) 102 94  Resp: 16 17  Temp: 98.8 F (37.1 C) 98.3 F (36.8 C)  SpO2: 100% 98%    General Awake, no distress.  NAD HEENT NCAT. PERRL. EOMI. No rhinorrhea. Mucous membranes are moist.  CV:  Good peripheral perfusion.  RESP:  Normal effort.  SKIN:  Left buttocks with a firm area of induration and pointing to the medial left buttocks at the gluteal cleft inferiorly.   ED Results / Procedures / Treatments   Labs (all labs ordered are listed, but only abnormal results are displayed) Labs Reviewed - No data to display   EKG   RADIOLOGY  No results  found.   PROCEDURES:  Critical Care performed: No  .Incision and Drainage  Date/Time: 09/12/2024 3:16 PM  Performed by: Loyd Candida LULLA Aldona, PA-C Authorized by: Loyd Candida LULLA Aldona, PA-C   Consent:    Consent obtained:  Verbal   Consent given by:  Patient   Risks discussed:  Bleeding, incomplete drainage and pain Universal protocol:    Site/side marked: yes     Patient identity confirmed:  Verbally with patient Location:    Type:  Abscess   Size:  3   Location:  Lower extremity   Lower extremity location:  Buttock   Buttock location:  L buttock Pre-procedure details:    Skin preparation:  Povidone-iodine Sedation:    Sedation type:  None Anesthesia:    Anesthesia method:  Local infiltration   Local anesthetic:  Lidocaine  1% w/o epi Procedure type:    Complexity:  Simple Procedure details:    Ultrasound guidance: no     Needle aspiration: no     Incision  types:  Single straight   Incision depth:  Subcutaneous   Wound management:  Probed and deloculated   Drainage:  Purulent   Drainage amount:  Moderate   Wound treatment:  Wound left open   Packing materials:  1/4 in iodoform gauze   Amount 1/4 iodoform:  1 Post-procedure details:    Procedure completion:  Tolerated well, no immediate complications    MEDICATIONS ORDERED IN ED: Medications  lidocaine  (PF) (XYLOCAINE ) 1 % injection 5 mL (has no administration in time range)  doxycycline  (VIBRA -TABS) tablet 100 mg (100 mg Oral Given 09/12/24 1506)     IMPRESSION / MDM / ASSESSMENT AND PLAN / ED COURSE  I reviewed the triage vital signs and the nursing notes.                              Differential diagnosis includes, but is not limited to, abscess, boil, cellulitis, insect bite  Patient's presentation is most consistent with acute complicated illness / injury requiring diagnostic workup.  Patient's diagnosis is consistent with left buttocks abscess status post I&D.  Patient agreed to local  procedure performed with good expression of purulent drainage.  The open wound was packed with small amount of iodoform.  Patient will be discharged home with prescriptions for doxycycline  along with refills of his blood pressure medicine. Patient is to follow up with his PCP for packing removal in 3 days as needed or otherwise directed. Patient is given ED precautions to return to the ED for any worsening or new symptoms.     FINAL CLINICAL IMPRESSION(S) / ED DIAGNOSES   Final diagnoses:  Abscess  Primary hypertension     Rx / DC Orders   ED Discharge Orders          Ordered    doxycycline  (VIBRA -TABS) 100 MG tablet  2 times daily        09/12/24 1503    amLODipine  (NORVASC ) 10 MG tablet  Daily        09/12/24 1608    hydrochlorothiazide  (HYDRODIURIL ) 25 MG tablet  Daily        09/12/24 1608    HYDROcodone -acetaminophen  (NORCO/VICODIN) 5-325 MG tablet  3 times daily PRN        09/12/24 1608             Note:  This document was prepared using Dragon voice recognition software and may include unintentional dictation errors.    Loyd Candida LULLA Aldona, PA-C 09/12/24 CELESTER    Jacolyn Pae, MD 09/13/24 1102

## 2024-09-12 NOTE — ED Triage Notes (Signed)
 C/O boil to upper left posterior leg x 4 days.

## 2024-09-12 NOTE — Discharge Instructions (Signed)
 Keep the wound clean, dry, and covered.  Apply warm compresses to promote healing.  Leave the packing in 3 days.  Take the antibiotic as prescribed.
